# Patient Record
Sex: Male | Born: 1950 | Race: White | Hispanic: No | Marital: Married | Smoking: Never smoker
Health system: Southern US, Community
[De-identification: ages and names within clinical notes are randomized; demographics above are authoritative.]

## PROBLEM LIST (undated history)

## (undated) DIAGNOSIS — F329 Major depressive disorder, single episode, unspecified: Secondary | ICD-10-CM

## (undated) DIAGNOSIS — F32A Depression, unspecified: Secondary | ICD-10-CM

## (undated) HISTORY — PX: HERNIA REPAIR: SHX51

---

## 2016-05-04 DIAGNOSIS — G4700 Insomnia, unspecified: Secondary | ICD-10-CM | POA: Diagnosis not present

## 2016-05-11 DIAGNOSIS — R066 Hiccough: Secondary | ICD-10-CM | POA: Diagnosis not present

## 2016-05-18 ENCOUNTER — Encounter (HOSPITAL_COMMUNITY): Payer: Self-pay | Admitting: *Deleted

## 2016-05-18 ENCOUNTER — Emergency Department (HOSPITAL_COMMUNITY)
Admission: EM | Admit: 2016-05-18 | Discharge: 2016-05-18 | Disposition: A | Discharge status: Home / Self Care | Payer: PPO | Attending: Emergency Medicine | Admitting: Emergency Medicine

## 2016-05-18 ENCOUNTER — Emergency Department (HOSPITAL_COMMUNITY): Payer: PPO

## 2016-05-18 DIAGNOSIS — R066 Hiccough: Secondary | ICD-10-CM | POA: Insufficient documentation

## 2016-05-18 LAB — COMPREHENSIVE METABOLIC PANEL
ALT: 59 U/L (ref 17–63)
ANION GAP: 8 (ref 5–15)
AST: 42 U/L — ABNORMAL HIGH (ref 15–41)
Albumin: 3.3 g/dL — ABNORMAL LOW (ref 3.5–5.0)
Alkaline Phosphatase: 63 U/L (ref 38–126)
BUN: 18 mg/dL (ref 6–20)
CHLORIDE: 96 mmol/L — AB (ref 101–111)
CO2: 32 mmol/L (ref 22–32)
CREATININE: 0.84 mg/dL (ref 0.61–1.24)
Calcium: 8.7 mg/dL — ABNORMAL LOW (ref 8.9–10.3)
GFR calc non Af Amer: >60 mL/min (ref >60)
Glucose, Bld: 106 mg/dL — ABNORMAL HIGH (ref 65–99)
POTASSIUM: 3.2 mmol/L — AB (ref 3.5–5.1)
SODIUM: 136 mmol/L (ref 135–145)
Total Bilirubin: 0.6 mg/dL (ref 0.3–1.2)
Total Protein: 8 g/dL (ref 6.5–8.1)

## 2016-05-18 LAB — CBC
HCT: 36.9 % — ABNORMAL LOW (ref 39.0–52.0)
Hemoglobin: 12.1 g/dL — ABNORMAL LOW (ref 13.0–17.0)
MCH: 26.8 pg (ref 26.0–34.0)
MCHC: 32.8 g/dL (ref 30.0–36.0)
MCV: 81.8 fL (ref 78.0–100.0)
PLATELETS: 320 10*3/uL (ref 150–400)
RBC: 4.51 MIL/uL (ref 4.22–5.81)
RDW: 13.4 % (ref 11.5–15.5)
WBC: 19.6 10*3/uL — ABNORMAL HIGH (ref 4.0–10.5)

## 2016-05-18 MED ORDER — DIAZEPAM 5 MG PO TABS: 5.0000 mg | ORAL_TABLET | Freq: Three times a day (TID) | ORAL | 0 refills | Status: DC | PRN

## 2016-05-18 MED ORDER — DIAZEPAM 5 MG PO TABS
5.0000 mg | ORAL_TABLET | Freq: Once | ORAL | Status: AC
  Administered 2016-05-18: 5 mg via ORAL
  Filled 2016-05-18: qty 1

## 2016-05-18 MED ORDER — KETOROLAC TROMETHAMINE 30 MG/ML IJ SOLN
30.0000 mg | Freq: Once | INTRAMUSCULAR | Status: AC
Start: 2016-05-18 — End: 2016-05-18
  Administered 2016-05-18: 30 mg via INTRAVENOUS
  Filled 2016-05-18: qty 1

## 2016-05-18 MED ORDER — LORAZEPAM 2 MG/ML IJ SOLN
1.0000 mg | Freq: Once | INTRAMUSCULAR | Status: AC
  Administered 2016-05-18: 1 mg via INTRAVENOUS
  Filled 2016-05-18: qty 1

## 2016-05-18 NOTE — ED Triage Notes (Signed)
Pt complains of hiccups for the past 10 days. Pt states the hiccups started after he vomited. Pt denies nausea or emesis.

## 2016-05-18 NOTE — ED Notes (Signed)
Pt states that hiccups have seemed to have gone away at this time.

## 2016-05-18 NOTE — ED Provider Notes (Signed)
[facility] DEPT Provider Note   CSN: [id number] Arrival date & time: 05/18/16  1134  First Provider Contact:  First MD Initiated Contact with Patient 05/18/16 1309        History   Chief Complaint Chief Complaint  Patient presents with  . Hiccups    HPI Caleb Berg is a 65 y.o. male.  Patient reports persistent hiccups for the past 12 days.  He did have some resolution of his pickups 2 days ago for several hours but then they began again.  He's been to the urgent care twice and tried a medication which she does not know the name of without relief.  He denies chest pain shortness of breath.  He denies abdominal pain.  Denies nausea vomiting or diarrhea.  He reports his symptoms are debilitating and affecting his sleep.   The history is provided by the patient and medical records.    History reviewed. No pertinent past medical history.  There are no active problems to display for this patient.   Past Surgical History:  Procedure Laterality Date  . HERNIA REPAIR         Home Medications    Prior to Admission medications   Not on File    Family History No family history on file.  Social History Social History  Substance Use Topics  . Smoking status: Never Smoker  . Smokeless tobacco: Never Used  . Alcohol use No     Allergies   Review of patient's allergies indicates not on file.   Review of Systems Review of Systems  All other systems reviewed and are negative.    Physical Exam Updated Vital Signs BP 135/85 (BP Location: Left Arm)   Pulse 94   Temp 99 F (37.2 C) (Oral)   Resp 21   SpO2 100%   Physical Exam  Constitutional: He is oriented to person, place, and time. He appears well-developed and well-nourished.  HENT:  Head: Normocephalic and atraumatic.  Eyes: EOM are normal.  Neck: Normal range of motion.  Cardiovascular: Normal rate, regular rhythm, normal heart sounds and intact distal pulses.   Pulmonary/Chest: Effort normal and  breath sounds normal. No respiratory distress.  Persistent diaphragmatic pickups present  Abdominal: Soft. He exhibits no distension. There is no tenderness.  Musculoskeletal: Normal range of motion.  Neurological: He is alert and oriented to person, place, and time.  Skin: Skin is warm and dry.  Psychiatric: He has a normal mood and affect. Judgment normal.  Nursing note and vitals reviewed.    ED Treatments / Results  Labs (all labs ordered are listed, but only abnormal results are displayed) Labs Reviewed  CBC  COMPREHENSIVE METABOLIC PANEL    EKG  EKG Interpretation None       Radiology No results found.  Procedures Procedures (including critical care time)  Medications Ordered in ED Medications  LORazepam (ATIVAN) injection 1 mg (1 mg Intravenous Given 05/18/16 1335)  ketorolac (TORADOL) 30 MG/ML injection 30 mg (30 mg Intravenous Given 05/18/16 1335)     Initial Impression / Assessment and Plan / ED Course  I have reviewed the triage vital signs and the nursing notes.  Pertinent labs & imaging results that were available during my care of the patient were reviewed by me and considered in my medical decision making (see chart for details).  Clinical Course    3:09 PM Hiccups resolved.  Labs and x-rays without abnormality.  Discharge home with when necessary Valium for hiccups  Final Clinical  Impressions(s) / ED Diagnoses   Final diagnoses:  Hiccups    New Prescriptions New Prescriptions   No medications on file     Jola Schmidt, MD 05/18/16 [phone]

## 2016-05-18 NOTE — ED Notes (Signed)
Patient states that he has no medical history other than a hernia repair in 2005.

## 2016-11-23 DIAGNOSIS — G479 Sleep disorder, unspecified: Secondary | ICD-10-CM | POA: Diagnosis not present

## 2016-12-06 DIAGNOSIS — G478 Other sleep disorders: Secondary | ICD-10-CM | POA: Diagnosis not present

## 2016-12-08 ENCOUNTER — Encounter: Payer: Self-pay | Admitting: Physician Assistant

## 2016-12-08 ENCOUNTER — Ambulatory Visit (INDEPENDENT_AMBULATORY_CARE_PROVIDER_SITE_OTHER): Payer: PPO | Admitting: Physician Assistant

## 2016-12-08 VITALS — BP 143/87 | HR 65 | Temp 98.2°F | Resp 18 | Ht 66.0 in | Wt 165.0 lb

## 2016-12-08 DIAGNOSIS — G479 Sleep disorder, unspecified: Secondary | ICD-10-CM | POA: Diagnosis not present

## 2016-12-08 MED ORDER — HYDROXYZINE HCL 50 MG PO TABS: ORAL_TABLET | ORAL | 1 refills | Status: DC

## 2016-12-08 NOTE — Patient Instructions (Addendum)
Try taking hydroxyzine 1/2 tablet nightly for sleep can increase up to 2 tablets a night for sleep. Take one hour prior to when you would like to go to sleep. Also read up on sleep hygeine that I printed out for you. Follow up in 4 weeks for sleep reevaluation.  Thank you for letting me participate in your health and well being.      Insomnia Insomnia is a sleep disorder that makes it difficult to fall asleep or to stay asleep. Insomnia can cause tiredness (fatigue), low energy, difficulty concentrating, mood swings, and poor performance at work or school. There are three different ways to classify insomnia:  Difficulty falling asleep.  Difficulty staying asleep.  Waking up too early in the morning. Any type of insomnia can be long-term (chronic) or short-term (acute). Both are common. Short-term insomnia usually lasts for three months or less. Chronic insomnia occurs at least three times a week for longer than three months. What are the causes? Insomnia may be caused by another condition, situation, or substance, such as:  Anxiety.  Certain medicines.  Gastroesophageal reflux disease (GERD) or other gastrointestinal conditions.  Asthma or other breathing conditions.  Restless legs syndrome, sleep apnea, or other sleep disorders.  Chronic pain.  Menopause. This may include hot flashes.  Stroke.  Abuse of alcohol, tobacco, or illegal drugs.  Depression.  Caffeine.  Neurological disorders, such as Alzheimer disease.  An overactive thyroid (hyperthyroidism). The cause of insomnia may not be known. What increases the risk? Risk factors for insomnia include:  Gender. Women are more commonly affected than men.  Age. Insomnia is more common as you get older.  Stress. This may involve your professional or personal life.  Income. Insomnia is more common in people with lower income.  Lack of exercise.  Irregular work schedule or night shifts.  Traveling between  different time zones. What are the signs or symptoms? If you have insomnia, trouble falling asleep or trouble staying asleep is the main symptom. This may lead to other symptoms, such as:  Feeling fatigued.  Feeling nervous about going to sleep.  Not feeling rested in the morning.  Having trouble concentrating.  Feeling irritable, anxious, or depressed. How is this treated? Treatment for insomnia depends on the cause. If your insomnia is caused by an underlying condition, treatment will focus on addressing the condition. Treatment may also include:  Medicines to help you sleep.  Counseling or therapy.  Lifestyle adjustments. Follow these instructions at home:  Take medicines only as directed by your health care provider.  Keep regular sleeping and waking hours. Avoid naps.  Keep a sleep diary to help you and your health care provider figure out what could be causing your insomnia. Include:  When you sleep.  When you wake up during the night.  How well you sleep.  How rested you feel the next day.  Any side effects of medicines you are taking.  What you eat and drink.  Make your bedroom a comfortable place where it is easy to fall asleep:  Put up shades or special blackout curtains to block light from outside.  Use a white noise machine to block noise.  Keep the temperature cool.  Exercise regularly as directed by your health care provider. Avoid exercising right before bedtime.  Use relaxation techniques to manage stress. Ask your health care provider to suggest some techniques that may work well for you. These may include:  Breathing exercises.  Routines to release muscle tension.  Visualizing peaceful scenes.  Cut back on alcohol, caffeinated beverages, and cigarettes, especially close to bedtime. These can disrupt your sleep.  Do not overeat or eat spicy foods right before bedtime. This can lead to digestive discomfort that can make it hard for you to  sleep.  Limit screen use before bedtime. This includes:  Watching TV.  Using your smartphone, tablet, and computer.  Stick to a routine. This can help you fall asleep faster. Try to do a quiet activity, brush your teeth, and go to bed at the same time each night.  Get out of bed if you are still awake after 15 minutes of trying to sleep. Keep the lights down, but try reading or doing a quiet activity. When you feel sleepy, go back to bed.  Make sure that you drive carefully. Avoid driving if you feel very sleepy.  Keep all follow-up appointments as directed by your health care provider. This is important. Contact a health care provider if:  You are tired throughout the day or have trouble in your daily routine due to sleepiness.  You continue to have sleep problems or your sleep problems get worse. Get help right away if:  You have serious thoughts about hurting yourself or someone else. This information is not intended to replace advice given to you by your health care provider. Make sure you discuss any questions you have with your health care provider. Document Released: 10/01/2000 Document Revised: 03/05/2016 Document Reviewed: 07/05/2014 Elsevier Interactive Patient Education  2017 [employer].     IF you received an x-ray today, you will receive an invoice from [facility] Rad[facility]. Please contact [facility] Rad[facility] at [phone] with questions or concerns regarding your invoice.   IF you received labwork today, you will receive an invoice from [facility]. Please contact [facility] at [phone] with questions or concerns regarding your invoice.   Our billing staff will not be able to assist you with questions regarding bills from these companies.  You will be contacted with the lab results as soon as they are available. The fastest way to get your results is to activate your My Chart account. Instructions are located on the last page of this paperwork. If you have not  heard from [place] regarding the results in 2 weeks, please contact this office.

## 2016-12-08 NOTE — Progress Notes (Signed)
   Kelston Masar  MRN: [id number] DOB: Mar 22, 1951  Subjective:  Caleb Berg is a 66 y.o. male seen in office today for a chief complaint of sleep issues x 7 years since his divorce. Notes he does not have problems going to sleep but it has issues staying asleep. He typically goes to bed at 8pm and will wake up around 1-2am and then again at 4am. He typically will try to just roll over and go back to sleep but this can take up to an hour. Denies snoring. He does not feel as if his inability to go back to sleep has anything to do with underlying depression or anxiety. He does feel very tired throughout the day. He tries to avoid naps throughout the day. He avoids caffeine after noon. Does not drink alcohol or smoke cigarettes. He uses his room only for sleeping. He works at [employer] and recently changed shifts and thinks this is contributing to the issue. He has tried advil pm and melantonin and notes it does not do anything for him. He has not tried any relaxation techniques and is very set on receiving medication to help with sleep. No FH of sleep apnea.   Review of Systems  Constitutional: Negative for chills and diaphoresis.  HENT: Negative for congestion.   Respiratory: Negative for cough and shortness of breath.   Cardiovascular: Negative for chest pain and palpitations.  Psychiatric/Behavioral: Negative for dysphoric mood. The patient is not nervous/anxious.     There are no active problems to display for this patient.  No current outpatient prescriptions on file prior to visit.   No current facility-administered medications on file prior to visit.     No Known Allergies   Objective:  BP (!) 143/87   Pulse 65   Temp 98.2 F (36.8 C) (Oral)   Resp 18   Ht 5\' 6"  (1.676 m)   Wt 165 lb (74.8 kg)   SpO2 100%   BMI 26.63 kg/m   Physical Exam  Constitutional: He is oriented to person, place, and time and well-developed, well-nourished, and in no distress.  HENT:  Head:  Normocephalic and atraumatic.  Eyes: Conjunctivae are normal.  Neck: Normal range of motion.  Cardiovascular: Normal rate, regular rhythm and normal heart sounds.   Pulmonary/Chest: Effort normal and breath sounds normal.  Neurological: He is alert and oriented to person, place, and time. Gait normal.  Skin: Skin is warm and dry.  Psychiatric: Affect normal.  Vitals reviewed.  Assessment and Plan :  1. Sleep disturbance Pt given educational material on cognitive behavioral therapy techniques to help with sleeping and instructed to give these techniques a try. Had a long discussion with patient that research shows cognitive behavior therapy is more effective long term while sleep medication is usually on a temporary solution. Pt given Rx for hydroxyzine and instructed to start out at low dose and taper up to 2 tablets at night to help with sleep. Instructed to RTC in 4 weeks for reevaluation.  - hydrOXYzine (ATARAX/VISTARIL) 50 MG tablet; Take 1/2-2 tablets as needed for sleep.  Dispense: 60 tablet; Refill: 1  A total of 30 minutes was spent in the room with the patient, greater than 50% of which was in discussion of treatment for patient's sleep disturbance.   Tenna Delaine PA-C  [facility] 12/08/2016 5:02 PM

## 2016-12-13 ENCOUNTER — Telehealth: Payer: Self-pay

## 2016-12-13 NOTE — Telephone Encounter (Signed)
Form also sent to [place], in [facility] box

## 2016-12-13 NOTE — Telephone Encounter (Signed)
Hydroxyzine not covered   Key [id number] Cover mymeds

## 2016-12-14 NOTE — Telephone Encounter (Signed)
I have completed the forms and placed them in the "to be faxed" box.

## 2017-01-05 DIAGNOSIS — F332 Major depressive disorder, recurrent severe without psychotic features: Secondary | ICD-10-CM | POA: Diagnosis not present

## 2017-01-06 DIAGNOSIS — F332 Major depressive disorder, recurrent severe without psychotic features: Secondary | ICD-10-CM | POA: Diagnosis not present

## 2017-01-11 ENCOUNTER — Emergency Department (HOSPITAL_COMMUNITY)
Admission: EM | Admit: 2017-01-11 | Discharge: 2017-01-12 | Disposition: A | Discharge status: Other Acute Inpatient Hospital | Payer: PPO | Attending: Emergency Medicine | Admitting: Emergency Medicine

## 2017-01-11 ENCOUNTER — Encounter (HOSPITAL_COMMUNITY): Payer: Self-pay

## 2017-01-11 ENCOUNTER — Ambulatory Visit (HOSPITAL_COMMUNITY)
Admission: EM | Admit: 2017-01-11 | Discharge: 2017-01-11 | Disposition: A | Discharge status: Home / Self Care | Payer: PPO | Source: Home / Self Care

## 2017-01-11 DIAGNOSIS — R45851 Suicidal ideations: Secondary | ICD-10-CM

## 2017-01-11 DIAGNOSIS — F32A Depression, unspecified: Secondary | ICD-10-CM

## 2017-01-11 DIAGNOSIS — Z79899 Other long term (current) drug therapy: Secondary | ICD-10-CM | POA: Diagnosis not present

## 2017-01-11 DIAGNOSIS — F329 Major depressive disorder, single episode, unspecified: Secondary | ICD-10-CM | POA: Insufficient documentation

## 2017-01-11 LAB — CBC
HEMATOCRIT: 39.4 % (ref 39.0–52.0)
HEMOGLOBIN: 13 g/dL (ref 13.0–17.0)
MCH: 28.3 pg (ref 26.0–34.0)
MCHC: 33 g/dL (ref 30.0–36.0)
MCV: 85.7 fL (ref 78.0–100.0)
Platelets: 180 10*3/uL (ref 150–400)
RBC: 4.6 MIL/uL (ref 4.22–5.81)
RDW: 14.2 % (ref 11.5–15.5)
WBC: 7.6 10*3/uL (ref 4.0–10.5)

## 2017-01-11 LAB — RAPID URINE DRUG SCREEN, HOSP PERFORMED
Amphetamines: NOT DETECTED
BARBITURATES: NOT DETECTED
Benzodiazepines: NOT DETECTED
COCAINE: NOT DETECTED
Opiates: NOT DETECTED
TETRAHYDROCANNABINOL: NOT DETECTED

## 2017-01-11 LAB — COMPREHENSIVE METABOLIC PANEL
ALBUMIN: 3.7 g/dL (ref 3.5–5.0)
ALT: 40 U/L (ref 17–63)
AST: 30 U/L (ref 15–41)
Alkaline Phosphatase: 71 U/L (ref 38–126)
Anion gap: 10 (ref 5–15)
BILIRUBIN TOTAL: 0.4 mg/dL (ref 0.3–1.2)
BUN: 18 mg/dL (ref 6–20)
CALCIUM: 9.3 mg/dL (ref 8.9–10.3)
CHLORIDE: 97 mmol/L — AB (ref 101–111)
CO2: 30 mmol/L (ref 22–32)
Creatinine, Ser: 0.95 mg/dL (ref 0.61–1.24)
GFR calc Af Amer: >60 mL/min (ref >60)
GFR calc non Af Amer: >60 mL/min (ref >60)
GLUCOSE: 180 mg/dL — AB (ref 65–99)
POTASSIUM: 4.3 mmol/L (ref 3.5–5.1)
Sodium: 137 mmol/L (ref 135–145)
TOTAL PROTEIN: 6.5 g/dL (ref 6.5–8.1)

## 2017-01-11 LAB — ACETAMINOPHEN LEVEL

## 2017-01-11 LAB — SALICYLATE LEVEL: Salicylate Lvl: <7 mg/dL (ref 2.8–30.0)

## 2017-01-11 LAB — ETHANOL: Alcohol, Ethyl (B): <5 mg/dL (ref <5)

## 2017-01-11 NOTE — ED Triage Notes (Signed)
Pt endorses depression and states "I go to bed every night hoping that I won't wake up and I've tried to kill myself 3 times in the last 2-3 weeks by overdosing on sleeping pills" pt states that he has no current plan for hurting self stating "I've given up on trying to kill myself because I can't figure out how to do it" VSS. Pt alert and oriented x 4. Pt denies alcohol or drug use and denies hallucinations. Pt cooperative in triage. Pt not on any medications currently.

## 2017-01-11 NOTE — ED Provider Notes (Signed)
  Face-to-face evaluation   History: She reports helplessness or hopelessness and depressive symptoms, related to loss of job, divorce, and ongoing chronic depression.  Does not currently see a therapist or take medications.  He has had thoughts of suicide, but no current active plan.  Physical exam: Alert, cooperative.  No dysarthria or aphasia or nystagmus.  No respiratory distress.  He is cooperative and interactive.  Medical screening examination/treatment/procedure(s) were conducted as a shared visit with non-physician practitioner(s) and myself.  I personally evaluated the patient during the encounter   Daleen Bo, MD 01/12/17 [phone]

## 2017-01-11 NOTE — ED Notes (Signed)
Pt and family are aware of waiting room assignment

## 2017-01-11 NOTE — BH Assessment (Signed)
Tele Assessment Note   Caleb Berg is an 66 y.o. male presenting to the ER today after several discouraging events over the last few weeks. The patient has untreated depression. Reports overdosing numerous times in his life, last time was three weeks ago. The patient had a job at [employer]. His hours were decreased and he was unable to pay his bills. The patient first reported he lost his job but then admitted he quit three weeks ago and hasn't been able to find another job.  Reported other stressors include his divorce in 2011 and his lack of relationship with is daughter that started about the same time.   The patient often deflects with humor when asked questions about his life. He denies depressive symptoms but appeared to have self loathing and feelings of worthlessness, euthymic mood, was oriented, had fair judgement, poor insight.  He denies he is currently SI, HI or A/V. The patient went to an outpatient clinic recently and started on anti-depressants but quite after two days. A nurse also came out his home for a 2 hr home visit yesterday. She recommends he go to [place] and a [employer] for financial help. He was told today at [facility] they could not help him and needed a receipt of his bill in order for the church to help.   He was discouraged by this, called a friend who told him to go to the ER.  When asked what would keep him from hurting himself he stated he was too chicken to do it. The patient's history does not support this statement or his recent attempt three weeks ago on sleeping pills.  The patient denied any history of substance abuse past or present but then admitted to a DUI in 64'. Denies any abuse history. Reports 2 yrs of college. No criminal charges or court dates.  No weapons. Admits to anxiety. The patient is willing to sign himself in voluntarily. No previous inpatient.   Patriciaann Clan NP recommends inpatient treatment on a voluntary basis.   Diagnosis: Major Depressive  Disorder, recurrent severe, without psychosis   Past Medical History: History reviewed. No pertinent past medical history.  Past Surgical History:  Procedure Laterality Date  . HERNIA REPAIR      Family History: History reviewed. No pertinent family history.  Social History:  reports that he has never smoked. He has never used smokeless tobacco. He reports that he does not drink alcohol or use drugs.  Additional Social History:  Alcohol / Drug Use Pain Medications: see MAR Prescriptions: see MAR Over the Counter: see MAR History of alcohol / drug use?: No history of alcohol / drug abuse  CIWA: CIWA-Ar BP: (!) 146/84 Pulse Rate: 72 COWS:    PATIENT STRENGTHS: (choose at least two) Average or above average intelligence Capable of independent living  Allergies: No Known Allergies  Home Medications:  (Not in a hospital admission)  OB/GYN Status:  No LMP for male patient.  General Assessment Data Location of Assessment: [facility] ED TTS Assessment: In system Is this a Tele or Face-to-Face Assessment?: Tele Assessment Is this an Initial Assessment or a Re-assessment for this encounter?: Initial Assessment Marital status: Divorced Is patient pregnant?: No Pregnancy Status: No Living Arrangements: Alone Can pt return to current living arrangement?: Yes Admission Status: Voluntary Is patient capable of signing voluntary admission?: Yes Referral Source: Self/Family/Friend Insurance type: [employer] advantage  Medical Screening Exam ([place]) Medical Exam completed: Yes  Crisis Care Plan Living Arrangements: Alone Name  of Psychiatrist: n/a Name of Therapist: n/a  Education Status Is patient currently in school?: No Highest grade of school patient has completed: 2 yrs of college  Risk to self with the past 6 months Suicidal Ideation: Yes-Currently Present Has patient been a risk to self within the past 6 months prior to admission? : Yes Suicidal Intent: No Has  patient had any suicidal intent within the past 6 months prior to admission? : Yes Is patient at risk for suicide?: Yes Suicidal Plan?: No Has patient had any suicidal plan within the past 6 months prior to admission? : Yes Access to Means: Yes Specify Access to Suicidal Means: pills What has been your use of drugs/alcohol within the last 12 months?: n/a Previous Attempts/Gestures: Yes How many times?: 5 Triggers for Past Attempts: Unknown Intentional Self Injurious Behavior: None Family Suicide History: Unknown Recent stressful life event(s): Financial Problems Persecutory voices/beliefs?: No Depression: Yes Depression Symptoms: Feeling worthless/self pity Substance abuse history and/or treatment for substance abuse?: No Suicide prevention information given to non-admitted patients: Not applicable  Risk to Others within the past 6 months Homicidal Ideation: No Does patient have any lifetime risk of violence toward others beyond the six months prior to admission? : No Thoughts of Harm to Others: No Current Homicidal Intent: No Current Homicidal Plan: No Access to Homicidal Means: No History of harm to others?: No Assessment of Violence: None Noted Does patient have access to weapons?: No Criminal Charges Pending?: No Does patient have a court date: No Is patient on probation?: No  Psychosis Hallucinations: None noted Delusions: None noted  Mental Status Report Appearance/Hygiene: Unremarkable Eye Contact: Good Motor Activity: Freedom of movement Speech: Logical/coherent Level of Consciousness: Alert Mood: Helpless, Euthymic Affect: Inconsistent with thought content Anxiety Level: Moderate Thought Processes: Coherent, Relevant Judgement: Partial Orientation: Person, Place, Time, Situation Obsessive Compulsive Thoughts/Behaviors: None  Cognitive Functioning Concentration: Normal Memory: Recent Intact, Remote Intact IQ: Average Insight: Poor Impulse Control:  Fair Appetite: Good Sleep: No Change Vegetative Symptoms: None  ADLScreening [facility] Assessment Services) Patient's cognitive ability adequate to safely complete daily activities?: Yes Patient able to express need for assistance with ADLs?: Yes Independently performs ADLs?: Yes (appropriate for developmental age)  Prior Inpatient Therapy Prior Inpatient Therapy: No  Prior Outpatient Therapy Prior Outpatient Therapy: Yes Prior Therapy Dates: past and most recently last week Prior Therapy Facilty/Provider(s): [address] Reason for Treatment: Depression Does patient have an ACCT team?: Yes Does patient have Intensive In-House Services?  : No Does patient have [facility] services? : Yes Does patient have P4CC services?: No  ADL Screening (condition at time of admission) Patient's cognitive ability adequate to safely complete daily activities?: Yes Is the patient deaf or have difficulty hearing?: No Does the patient have difficulty concentrating, remembering, or making decisions?: No Patient able to express need for assistance with ADLs?: Yes Does the patient have difficulty dressing or bathing?: No Independently performs ADLs?: Yes (appropriate for developmental age)            Regulatory affairs officer (For Healthcare) Does Patient Have a Medical Advance Directive?: No Would patient like information on creating a medical advance directive?: No - Patient declined    Additional Information 1:1 In Past 12 Months?: No CIRT Risk: No Elopement Risk: No Does patient have medical clearance?: Yes     Disposition:  Disposition Initial Assessment Completed for this Encounter: Yes Disposition of Patient: Inpatient treatment program Type of inpatient treatment program: Adult  Mollie Germany 01/11/2017 11:52 PM

## 2017-01-11 NOTE — ED Notes (Signed)
Pt changing into scrubs. Staffing notified of need for sitter.

## 2017-01-11 NOTE — ED Provider Notes (Signed)
[facility] DEPT Provider Note   CSN: [id number] Arrival date & time: 01/11/17  1557     History   Chief Complaint Chief Complaint  Patient presents with  . Depression  . Suicidal    HPI Caleb Berg is a 66 y.o. male.  This a 66 year old male with a long-standing history of depression.  No hospitalizations.  His recently become more depressed.  He does not take any medication for his symptoms as he states he's taken that in the past and they've not worked.  Has no therapy or counselor at this time. He recently lost his job and is concerned about paying bills for the past 3 weeks.  He's at several episodes/attempts at suicide by taking sleeping pills, 5-10 at a time, but is upset with himself because he's been unsuccessful.      History reviewed. No pertinent past medical history.  There are no active problems to display for this patient.   Past Surgical History:  Procedure Laterality Date  . HERNIA REPAIR         Home Medications    Prior to Admission medications   Medication Sig Start Date End Date Taking? Authorizing Provider  hydrOXYzine (ATARAX/VISTARIL) 50 MG tablet Take 1/2-2 tablets as needed for sleep. Patient taking differently: Take 25-50 mg by mouth at bedtime as needed (sleep).  12/08/16  Yes Leonie Douglas, PA-C    Family History History reviewed. No pertinent family history.  Social History Social History  Substance Use Topics  . Smoking status: Never Smoker  . Smokeless tobacco: Never Used  . Alcohol use No     Allergies   Patient has no known allergies.   Review of Systems Review of Systems  Constitutional: Negative for fever.  Respiratory: Negative for cough.   Cardiovascular: Negative for chest pain.  Neurological: Negative for headaches.  Psychiatric/Behavioral: Positive for suicidal ideas.  All other systems reviewed and are negative.    Physical Exam Updated Vital Signs BP (!) 146/84 (BP Location: Left Arm)    Pulse 72   Temp 98 F (36.7 C) (Oral)   Resp 20   SpO2 98%   Physical Exam  Constitutional: He is oriented to person, place, and time. He appears well-developed and well-nourished.  HENT:  Head: Normocephalic.  Eyes: Pupils are equal, round, and reactive to light.  Pulmonary/Chest: Effort normal.  Musculoskeletal: Normal range of motion.  Neurological: He is alert and oriented to person, place, and time.  Skin: Skin is warm and dry.  Psychiatric: His speech is normal and behavior is normal. Judgment normal. Cognition and memory are normal. He exhibits a depressed mood. He expresses suicidal ideation. He expresses suicidal plans.  Nursing note and vitals reviewed.    ED Treatments / Results  Labs (all labs ordered are listed, but only abnormal results are displayed) Labs Reviewed  COMPREHENSIVE METABOLIC PANEL - Abnormal; Notable for the following:       Result Value   Chloride 97 (*)    Glucose, Bld 180 (*)    All other components within normal limits  ACETAMINOPHEN LEVEL - Abnormal; Notable for the following:    Acetaminophen (Tylenol), Serum <10 (*)    All other components within normal limits  ETHANOL  SALICYLATE LEVEL  CBC  RAPID URINE DRUG SCREEN, HOSP PERFORMED    EKG  EKG Interpretation None       Radiology No results found.  Procedures Procedures (including critical care time)  Medications Ordered in ED Medications - No data  to display   Initial Impression / Assessment and Plan / ED Course  I have reviewed the triage vital signs and the nursing notes.  Pertinent labs & imaging results that were available during my care of the patient were reviewed by me and considered in my medical decision making (see chart for details).      Patient has been medically cleared for TTS as evaluation TTS evaluation has been completed.  Recommended for inpatient placement Final Clinical Impressions(s) / ED Diagnoses   Final diagnoses:  Depression, unspecified  depression type  Suicidal ideations    New Prescriptions New Prescriptions   No medications on file     Junius Creamer, NP 01/11/17 2218    Junius Creamer, NP 01/12/17 0025    Daleen Bo, MD 01/12/17 3568

## 2017-01-11 NOTE — ED Notes (Signed)
TTS in progress 

## 2017-01-12 ENCOUNTER — Encounter (HOSPITAL_COMMUNITY): Payer: Self-pay | Admitting: *Deleted

## 2017-01-12 ENCOUNTER — Inpatient Hospital Stay (HOSPITAL_COMMUNITY)
Admission: EM | Admit: 2017-01-12 | Discharge: 2017-01-15 | DRG: 885 | Disposition: A | Discharge status: Home / Self Care | Payer: PPO | Source: Intra-hospital | Attending: Psychiatry | Admitting: Psychiatry

## 2017-01-12 DIAGNOSIS — Z818 Family history of other mental and behavioral disorders: Secondary | ICD-10-CM | POA: Diagnosis not present

## 2017-01-12 DIAGNOSIS — Z560 Unemployment, unspecified: Secondary | ICD-10-CM

## 2017-01-12 DIAGNOSIS — F332 Major depressive disorder, recurrent severe without psychotic features: Principal | ICD-10-CM

## 2017-01-12 DIAGNOSIS — Z79899 Other long term (current) drug therapy: Secondary | ICD-10-CM

## 2017-01-12 DIAGNOSIS — F419 Anxiety disorder, unspecified: Secondary | ICD-10-CM | POA: Diagnosis present

## 2017-01-12 DIAGNOSIS — Z915 Personal history of self-harm: Secondary | ICD-10-CM

## 2017-01-12 DIAGNOSIS — Z639 Problem related to primary support group, unspecified: Secondary | ICD-10-CM

## 2017-01-12 DIAGNOSIS — G4700 Insomnia, unspecified: Secondary | ICD-10-CM | POA: Diagnosis present

## 2017-01-12 DIAGNOSIS — R45851 Suicidal ideations: Secondary | ICD-10-CM

## 2017-01-12 LAB — LIPID PANEL
CHOL/HDL RATIO: 4.3 ratio
Cholesterol: 258 mg/dL — ABNORMAL HIGH (ref 0–200)
HDL: 60 mg/dL (ref >40)
LDL Cholesterol: 160 mg/dL — ABNORMAL HIGH (ref 0–99)
Triglycerides: 190 mg/dL — ABNORMAL HIGH (ref <150)
VLDL: 38 mg/dL (ref 0–40)

## 2017-01-12 LAB — TSH: TSH: 6.108 u[IU]/mL — AB (ref 0.350–4.500)

## 2017-01-12 MED ORDER — HYDROXYZINE HCL 25 MG PO TABS
25.0000 mg | ORAL_TABLET | Freq: Four times a day (QID) | ORAL | Status: DC | PRN
  Filled 2017-01-12: qty 10

## 2017-01-12 MED ORDER — MAGNESIUM HYDROXIDE 400 MG/5ML PO SUSP: 30.0000 mL | Freq: Every day | ORAL | Status: DC | PRN

## 2017-01-12 MED ORDER — ACETAMINOPHEN 325 MG PO TABS
650.0000 mg | ORAL_TABLET | Freq: Four times a day (QID) | ORAL | Status: DC | PRN
  Administered 2017-01-12: 650 mg via ORAL
  Filled 2017-01-12: qty 2

## 2017-01-12 MED ORDER — DULOXETINE HCL 20 MG PO CPEP
20.0000 mg | ORAL_CAPSULE | Freq: Two times a day (BID) | ORAL | Status: DC
  Administered 2017-01-12 – 2017-01-15 (×7): 20 mg via ORAL
  Filled 2017-01-12 (×8): qty 1
  Filled 2017-01-12: qty 14
  Filled 2017-01-12: qty 1
  Filled 2017-01-12: qty 14
  Filled 2017-01-12 (×2): qty 1

## 2017-01-12 MED ORDER — TRAZODONE HCL 50 MG PO TABS
50.0000 mg | ORAL_TABLET | Freq: Every evening | ORAL | Status: DC | PRN
  Administered 2017-01-12: 50 mg via ORAL
  Filled 2017-01-12 (×6): qty 1

## 2017-01-12 NOTE — Tx Team (Signed)
Interdisciplinary Treatment and Diagnostic Plan Update 01/12/2017 Time of Session: 9:30am  Caleb Berg  MRN: [id number]  Principal Diagnosis: Major depressive disorder, recurrent episode  Secondary Diagnoses: Active Problems:   MDD (major depressive disorder), recurrent episode, severe (HCC)   Current Medications:  Current Facility-Administered Medications  Medication Dose Route Frequency Provider Last Rate Last Dose  . acetaminophen (TYLENOL) tablet 650 mg  650 mg Oral Q6H PRN Laverle Hobby, PA-C      . DULoxetine (CYMBALTA) DR capsule 20 mg  20 mg Oral BID Laverle Hobby, PA-C   20 mg at 01/12/17 2423  . hydrOXYzine (ATARAX/VISTARIL) tablet 25 mg  25 mg Oral Q6H PRN Laverle Hobby, PA-C      . magnesium hydroxide (MILK OF MAGNESIA) suspension 30 mL  30 mL Oral Daily PRN Laverle Hobby, PA-C      . traZODone (DESYREL) tablet 50 mg  50 mg Oral QHS,MR X 1 Laverle Hobby, PA-C        PTA Medications: Prescriptions Prior to Admission  Medication Sig Dispense Refill Last Dose  . hydrOXYzine (ATARAX/VISTARIL) 50 MG tablet Take 1/2-2 tablets as needed for sleep. (Patient taking differently: Take 25-50 mg by mouth at bedtime as needed (sleep). ) 60 tablet 1 Past Week at Unknown time    Treatment Modalities: Medication Management, Group therapy, Case management,  1 to 1 session with clinician, Psychoeducation, Recreational therapy.  Patient Stressors: Financial difficulties Occupational concerns Patient Strengths: Ability for insight Average or above average intelligence Capable of independent living Curator fund of knowledge Motivation for treatment/growth Supportive family/friends Work Artist for Primary Diagnosis: Major depressive disorder, recurrent episode Long Term Goal(s): Improvement in symptoms so as ready for discharge Short Term Goals: Ability to identify changes in lifestyle to reduce recurrence of condition will  improve Ability to verbalize feelings will improve Ability to disclose and discuss suicidal ideas Ability to demonstrate self-control will improve Ability to identify and develop effective coping behaviors will improve Compliance with prescribed medications will improve Ability to identify triggers associated with substance abuse/mental health issues will improve  Medication Management: Evaluate patient's response, side effects, and tolerance of medication regimen.  Therapeutic Interventions: 1 to 1 sessions, Unit Group sessions and Medication administration.  Evaluation of Outcomes: Not Met  Physician Treatment Plan for Secondary Diagnosis: Active Problems:   MDD (major depressive disorder), recurrent episode, severe ([place])  Long Term Goal(s): Improvement in symptoms so as ready for discharge  Short Term Goals: Ability to identify changes in lifestyle to reduce recurrence of condition will improve Ability to verbalize feelings will improve Ability to disclose and discuss suicidal ideas Ability to demonstrate self-control will improve Ability to identify and develop effective coping behaviors will improve Compliance with prescribed medications will improve Ability to identify triggers associated with substance abuse/mental health issues will improve  Medication Management: Evaluate patient's response, side effects, and tolerance of medication regimen.  Therapeutic Interventions: 1 to 1 sessions, Unit Group sessions and Medication administration.  Evaluation of Outcomes: Not Met  RN Treatment Plan for Primary Diagnosis: Major depressive disorder, recurrent episode Long Term Goal(s): Knowledge of disease and therapeutic regimen to maintain health will improve  Short Term Goals: Ability to verbalize feelings will improve and Compliance with prescribed medications will improve  Medication Management: RN will administer medications as ordered by provider, will assess and evaluate  patient's response and provide education to patient for prescribed medication. RN will report any adverse and/or side effects  to prescribing provider.  Therapeutic Interventions: 1 on 1 counseling sessions, Psychoeducation, Medication administration, Evaluate responses to treatment, Monitor vital signs and CBGs as ordered, Perform/monitor CIWA, COWS, AIMS and Fall Risk screenings as ordered, Perform wound care treatments as ordered.  Evaluation of Outcomes: Progressing  LCSW Treatment Plan for Primary Diagnosis: Major depressive disorder, recurrent episode Long Term Goal(s): Safe transition to appropriate next level of care at discharge, Engage patient in therapeutic group addressing interpersonal concerns. Short Term Goals: Engage patient in aftercare planning with referrals and resources, Increase emotional regulation, Identify triggers associated with mental health/substance abuse issues and Increase skills for wellness and recovery  Therapeutic Interventions: Assess for all discharge needs, 1 to 1 time with Social worker, Explore available resources and support systems, Assess for adequacy in community support network, Educate family and significant other(s) on suicide prevention, Complete Psychosocial Assessment, Interpersonal group therapy.  Evaluation of Outcomes: Not Met  Progress in Treatment: Attending groups: Yes Participating in groups: Yes Taking medication as prescribed: Yes, MD continues to assess for medication changes as needed Toleration medication: Yes, no side effects reported at this time Family/Significant other contact made: No, CSW assessing for appropriate contact Patient understands diagnosis: Continuing to assess Discussing patient identified problems/goals with staff: Yes Medical problems stabilized or resolved: Yes Denies suicidal/homicidal ideation: No, pt was recently admitted with suicidal ideation. Issues/concerns per patient self-inventory: None Other:  N/A  New problem(s) identified: None identified at this time.   New Short Term/Long Term Goal(s): None identified at this time.   Discharge Plan or Barriers: Pt will return home and follow-up with an outpatient provider.  Reason for Continuation of Hospitalization:  Anxiety  Depression Medication stabilization Suicidal ideation  Estimated Length of Stay: 3-5 days  Attendees: Patient: 01/12/2017 3:53 PM  Physician: Dr. Parke Poisson 01/12/2017 3:53 PM  Nursing: Chrys Racer RN; [place], RN 01/12/2017 3:53 PM  RN Care Manager: Lars Pinks, RN 01/12/2017 3:53 PM  Social Worker: Matthew Saras, [place] 01/12/2017 3:53 PM  Recreational Therapist:  01/12/2017 3:53 PM  Other: Lindell Spar, NP; Samuel Jester, NP 01/12/2017 3:53 PM  Other:  01/12/2017 3:53 PM  Other: 01/12/2017 3:53 PM   Scribe for Treatment Team: Georga Kaufmann, MSW,LCSWA 01/12/2017 3:53 PM
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
## 2017-01-12 NOTE — BH Assessment (Signed)
Patriciaann Clan NP recommends inpatient treatment on a voluntary basis.  [facility] accepts to bed 407-1 after 1am. Dr. Parke Poisson. Call report to [phone]

## 2017-01-12 NOTE — BHH Group Notes (Signed)
[facility] LCSW Group Therapy 01/12/2017 1:15 PM  Type of Therapy: Group Therapy- Emotion Regulation  Participation Level: Active   Participation Quality:  Appropriate  Affect: Appropriate  Cognitive: Alert and Oriented   Insight:  Developing/Improving  Engagement in Therapy: Developing/Improving and Engaged   Modes of Intervention: Clarification, Confrontation, Discussion, Education, Exploration, Limit-setting, Orientation, Problem-solving, Rapport Building, Art therapist, Socialization and Support  Summary of Progress/Problems: The topic for group today was emotional regulation. This group focused on both positive and negative emotion identification and allowed group members to process ways to identify feelings, regulate negative emotions, and find healthy ways to manage internal/external emotions. Group members were asked to reflect on a time when their reaction to an emotion led to a negative outcome and explored how alternative responses using emotion regulation would have benefited them. Group members were also asked to discuss a time when emotion regulation was utilized when a negative emotion was experienced. Pt identified love and sadness as emotions that he has a difficult time regulating. Pt states that his daughter has not wanted to have a relationship since pt's divorce from her mother. Pt states that this makes him very sad and when he gets out of the hospital he plans to reach out to his daughter to reconcile.    Georga Kaufmann, MSW, LCSWA 01/12/2017 4:01 PM

## 2017-01-12 NOTE — ED Notes (Signed)
Sitter remains at the bedside 

## 2017-01-12 NOTE — Progress Notes (Signed)
Recreation Therapy Notes  Date: 01/12/17 Time: 0930 Location: [address] Dayroom  Group Topic: Stress Management  Goal Area(s) Addresses:  Patient will verbalize importance of using healthy stress management.  Patient will identify positive emotions associated with healthy stress management.   Intervention: Stress Management  Activity :  Body Scan Meditation.  LRT introduced the stress management technique of meditation.  LRT played a meditation that allowed patients to focus on the feelings and sensations they were feeling.  Patients were to follow along as the meditation played to engage in the activity.  Education:  Stress Management, Discharge Planning.   Education Outcome: Acknowledges edcuation/In group clarification offered/Needs additional education  Clinical Observations/Feedback: Pt did not attend group.   Victorino Sparrow, LRT/CTRS         Victorino Sparrow A 01/12/2017 12:29 PM

## 2017-01-12 NOTE — BHH Suicide Risk Assessment (Signed)
[facility] Admission Suicide Risk Assessment   Nursing information obtained from:  Patient Demographic factors:  Age 66 or older, Divorced or widowed, Caucasian, Low socioeconomic status, Living alone, Unemployed Current Mental Status:  NA Loss Factors:  Financial problems / change in socioeconomic status Historical Factors:  Family history of mental illness or substance abuse Risk Reduction Factors:  Sense of responsibility to family, Positive coping skills or problem solving skills  Total Time spent with patient: 45 minutes Principal Problem:  Major Depression, Recurrent, Severe Diagnosis:   Patient Active Problem List   Diagnosis Date Noted  . MDD (major depressive disorder), recurrent episode, severe ([place]) [F33.2] 01/12/2017   Continued Clinical Symptoms:  Alcohol Use Disorder Identification Test Final Score (AUDIT): 0 The "Alcohol Use Disorders Identification Test", Guidelines for Use in Primary Care, Second Edition.  World Pharmacologist [facility]). Score between 0-7:  no or low risk or alcohol related problems. Score between 8-15:  moderate risk of alcohol related problems. Score between 16-19:  high risk of alcohol related problems. Score 20 or above:  warrants further diagnostic evaluation for alcohol dependence and treatment.   CLINICAL FACTORS:  Patient is a 66 year old divorced  male.Lives alone.  He reports a long history of depression, prior history of suicidal ideations and overdose attempts ,but has had no prior psychiatric admissions . Recent psychosocial stressors have contributed to increased depression- reports his job recently cut down his hours, resulting in increased  financial difficulties. He also describes a distant relationship with his adult daughter. Presented to ED on 3/27, due to worsening depression, suicidal ideations, reported recent suicide attempts by overdosing on sleeping medication. Denies alcohol or drug abuse  No history of medical  illnesses Diagnosis- MDD, Recurrent, no psychotic features, severe Plan - inpatient admission. Has been started on Cymbalta 20 mgrs BID initially. As TSH mildly elevated, will check T3, T4 .    Musculoskeletal: Strength & Muscle Tone: within normal limits Gait & Station: normal Patient leans: N/A  Psychiatric Specialty Exam: Physical Exam  ROS denies chest pain, no shortness of breath, no fever, no chills   Blood pressure (!) 131/91, pulse (!) 104, temperature 98 F (36.7 C), temperature source Oral, resp. rate 18, height 5' 4.25" (1.632 m), weight 74.4 kg (164 lb).Body mass index is 27.93 kg/m.  General Appearance: Well Groomed  Eye Contact:  Good  Speech:  Normal Rate  Volume:  Normal  Mood:  Depressed  Affect:  constricted, but smiles at times appropriately   Thought Process:  Linear and Descriptions of Associations: Intact  Orientation:  Full (Time, Place, and Person)  Thought Content:  denies hallucinations, no delusions expressed   Suicidal Thoughts:  No denies active suicidal or self injurious ideations at this time , and contracts for safety on unit, denies any violent or homicidal ideations  Homicidal Thoughts:  No  Memory:  recent and remote grossly intact   Judgement:  Fair  Insight:  Fair  Psychomotor Activity:  Normal  Concentration:  Concentration: Good and Attention Span: Good  Recall:  Good  Fund of Knowledge:  Good  Language:  Good  Akathisia:  No  Handed:  Right  AIMS (if indicated):     Assets:  Desire for Improvement  ADL's:  Intact  Cognition:  WNL  Sleep:  Number of Hours: 2.25      COGNITIVE FEATURES THAT CONTRIBUTE TO RISK:  Closed-mindedness and Loss of executive function    SUICIDE RISK:   Moderate:  Frequent suicidal ideation  with limited intensity, and duration, some specificity in terms of plans, no associated intent, good self-control, limited dysphoria/symptomatology, some risk factors present, and identifiable protective factors,  including available and accessible social support.  PLAN OF CARE: Patient will be admitted to inpatient psychiatric unit for stabilization and safety. Will provide and encourage milieu participation. Provide medication management and maked adjustments as needed.  Will follow daily.    I certify that inpatient services furnished can reasonably be expected to improve the patient's condition.   Jenne Campus, MD 01/12/2017, 3:33 PM

## 2017-01-12 NOTE — Progress Notes (Signed)
Patient ID: Caleb Berg, male   DOB: 1951-01-16, 66 y.o.   MRN: [id number] Client is a 67 yo male admitted due to "depression" Client denies history of psychiatric hospitalizations or OPT. Client denies SHI, AVH, but does report feeling depressed all of his life and notes familiar hx "my mother had it, my brother had it" Per admit report client reported SA over the past 2-3 weeks, by OD on sleeping pills three times, but gave up on killing himself. Client reports "I was working at [employer] about 20-25 hours a week and the Radio broadcast assistant resigned and they asked me if I'd take the position, not thinking about it I said yes, then realized after weeks had gone by I couldn't take the stress, I couldn't do now what I could in my twenties" "I didn't want to deal with the pressure, told them I wanted to step down, it took them about three week to hire another Radio broadcast assistant and when they did my hours was cut to about 16 hours a week and I couldn't pay any bills with that so I quit" "I'm dealing with divorce and I have a 16 yo daughter that doesn't talk to me" Client report he has a brother here in [place] that's supportive and a sister in [place]. Client denies any significant medical history. Client lives alone. Admission process completed, client offered food drink, oriented to the unit, notified of q37min safety checks. Client is safe on the unit.

## 2017-01-12 NOTE — ED Notes (Signed)
[facility] transport called for transfer to [facility]

## 2017-01-12 NOTE — Progress Notes (Signed)
D: Patient has flat, blunted affect.  He has irritable mood.  His BP was elevated this morning and notified MD of same.  Patient rates his depression and anxiety as a 3; hopelessness as a 5.  Patient is sleeping well; his appetite is good; his energy is normal and his concentration is good.  Patient denies any thoughts of self harm.  He denies HI and does not appear to be responding to internal stimuli.  Patient is observed in the day room interacting well with his peers.   A: Continue to monitor medication management and MD orders.  Safety checks continued every 15 minutes per protocol.  Offer support and encouragement as needed. R: Patient is receptive to staff; his behavior is appropriate.

## 2017-01-12 NOTE — H&P (Signed)
Psychiatric Admission Assessment Adult  Patient Identification: Caleb Berg MRN:  [id number]  Date of Evaluation:  01/13/2017  Chief Complaint: Worsening symptoms of depression triggering suicidal ideations.   Principal Diagnosis: Major depressive disorder, recurrent episode.  Diagnosis:   Patient Active Problem List   Diagnosis Date Noted  . MDD (major depressive disorder), recurrent episode, severe ([place]) [F33.2] 01/12/2017   History of Present Illness: This is an admission assessment for this 66 year old Caucasian male with hx of depression. Admitted to the [facility] adult unit from the [facility] with complaints of worsening symptoms of depression. He came to the hospital for mood stabilization treatment. This is his first inpatient psychiatric hospitalization. During this assessment, Caleb Berg reports, "I drove to the hospital yesterday afternoon. I'm depressed, out of job, stressing on how to pay my bills. Then, my divorce in 2011 hit me hard. I believe, that was when the depression set in. I have a daughter that did not have much to do with me. I have been out of job x 3 weeks. I was kind of depressed prior to the job loss, only that it worsened the depression. I was started on depression medicines 2 & 1/2 weeks ago by [facility], but I did not take them. If I take the medicine, it may help the depression & yet, I'm still out of job. I guess if I get better by taking the medications, I will then be happily broke. I'm trying to get my life on the right tract, then find another job. My siblings are trying to help me out now. I was suicidal, I attempted suicide twice in two weeks by overdose on sleeping pills. It did not kill me because I'm still here. I will like to get back on depression medicines again while I'm here".  Associated Signs/Symptoms:  Depression Symptoms:  depressed mood, insomnia, feelings of worthlessness/guilt, hopelessness,  (Hypo) Manic Symptoms:  Impulsivity,  Anxiety  Symptoms:  Excessive Worry,  Psychotic Symptoms:  Denies any hallucinations, delusional thoughts or paranoia.  PTSD Symptoms: Denies any PTSD symptoms or events.  Total Time spent with patient: 1 hour  Past Psychiatric History: Major depressive disorder.  Is the patient at risk to self? Yes.    Has the patient been a risk to self in the past 6 months? Yes.    Has the patient been a risk to self within the distant past? Yes.    Is the patient a risk to others? No.  Has the patient been a risk to others in the past 6 months? No.  Has the patient been a risk to others within the distant past? No.   Prior Inpatient Therapy: No Prior Outpatient Therapy: Yes Caleb Berg)  Alcohol Screening: Patient refused Alcohol Screening Tool: Yes 1. How often do you have a drink containing alcohol?: Never 9. Have you or someone else been injured as a result of your drinking?: No 10. Has a relative or friend or a doctor or another health worker been concerned about your drinking or suggested you cut down?: No Alcohol Use Disorder Identification Test Final Score (AUDIT): 0 Brief Intervention: Yes  Substance Abuse History in the last 12 months:  Yes.    Consequences of Substance Abuse: NA  Previous Psychotropic Medications: Yes, (Unable yo remember).  Psychological Evaluations: Yes   Past Medical History: History reviewed. No pertinent past medical history.  Past Surgical History:  Procedure Laterality Date  . HERNIA REPAIR     Family History: History reviewed. No pertinent  family history. Family Psychiatric  History: Major depression: father & siblings.  Tobacco Screening: Have you used any form of tobacco in the last 30 days? (Cigarettes, Smokeless Tobacco, Cigars, and/or Pipes): No  Social History:  History  Alcohol Use No     History  Drug Use No    Additional Social History: Marital status: Divorced Divorced, when?: Pt got divorced in 2011  What types of issues is patient dealing  with in the relationship?: Pt has no contact with his ex-wife  Additional relationship information: Pt was dating another woman but that relationship ended 3 weeks ago Does patient have children?: Yes How many children?: 1 (Daughter ) How is patient's relationship with their children?: Distant relationship with his daughter since the divorce but pt misses her a lot   Allergies:  No Known Allergies Lab Results:  Results for orders placed or performed during the hospital encounter of 01/12/17 (from the past 48 hour(s))  Hemoglobin A1c     Status: Abnormal   Collection Time: 01/12/17  6:09 AM  Result Value Ref Range   Hgb A1c MFr Bld 6.4 (H) 4.8 - 5.6 %    Comment: (NOTE)         Pre-diabetes: 5.7 - 6.4         Diabetes: >6.4         Glycemic control for adults with diabetes: <7.0    Mean Plasma Glucose 137 mg/dL    Comment: (NOTE) Performed At: [facility] [place], [place] [id number] Lindon Romp MD WV:[id number] Performed at [facility], [place] [address] [place], [place] [address]   Lipid panel     Status: Abnormal   Collection Time: 01/12/17  6:09 AM  Result Value Ref Range   Cholesterol 258 (H) 0 - 200 mg/dL   Triglycerides 190 (H) <150 mg/dL   HDL 60 >40 mg/dL   Total CHOL/HDL Ratio 4.3 RATIO   VLDL 38 0 - 40 mg/dL   LDL Cholesterol 160 (H) 0 - 99 mg/dL    Comment:        Total Cholesterol/HDL:CHD Risk Coronary Heart Disease Risk Table                     Men   Women  1/2 Average Risk   3.4   3.3  Average Risk       5.0   4.4  2 X Average Risk   9.6   7.1  3 X Average Risk  23.4   11.0        Use the calculated Patient Ratio above and the CHD Risk Table to determine the patient's CHD Risk.        ATP III CLASSIFICATION (LDL):  <100     mg/dL   Optimal  100-129  mg/dL   Near or Above                    Optimal  130-159  mg/dL   Borderline  160-189  mg/dL   High  >190     mg/dL   Very High Performed at [facility] [address] [place], [place] [address]   TSH     Status: Abnormal   Collection Time: 01/12/17  6:09 AM  Result Value Ref Range   TSH 6.108 (H) 0.350 - 4.500 uIU/mL    Comment: Performed by a 3rd Generation assay with a functional sensitivity of <=0.01 uIU/mL. Performed at  [facility], [place] [address] [place], [place] [address]   Prolactin     Status: Abnormal   Collection Time: 01/12/17  6:09 AM  Result Value Ref Range   Prolactin 42.2 (H) 4.0 - 15.2 ng/mL    Comment: (NOTE) Performed At: [facility] [place], [place] [id number] Lindon Romp MD [id number] Performed at [facility], [place] [address] [place], [place] [address]    Blood Alcohol level:  Lab Results  Component Value Date   ETH <5 44/12/4740   Metabolic Disorder Labs:  Lab Results  Component Value Date   HGBA1C 6.4 (H) 01/12/2017   MPG 137 01/12/2017   Lab Results  Component Value Date   PROLACTIN 42.2 (H) 01/12/2017   Lab Results  Component Value Date   CHOL 258 (H) 01/12/2017   TRIG 190 (H) 01/12/2017   HDL 60 01/12/2017   CHOLHDL 4.3 01/12/2017   VLDL 38 01/12/2017   LDLCALC 160 (H) 01/12/2017   Current Medications: Current Facility-Administered Medications  Medication Dose Route Frequency Provider Last Rate Last Dose  . acetaminophen (TYLENOL) tablet 650 mg  650 mg Oral Q6H PRN Laverle Hobby, PA-C   650 mg at 01/12/17 1559  . DULoxetine (CYMBALTA) DR capsule 20 mg  20 mg Oral BID Laverle Hobby, PA-C   20 mg at 01/13/17 0850  . hydrOXYzine (ATARAX/VISTARIL) tablet 25 mg  25 mg Oral Q6H PRN Laverle Hobby, PA-C      . magnesium hydroxide (MILK OF MAGNESIA) suspension 30 mL  30 mL Oral Daily PRN Laverle Hobby, PA-C      . traZODone (DESYREL) tablet 50 mg  50 mg Oral QHS,MR X 1 Laverle Hobby, PA-C   50 mg at 01/12/17 2217   PTA Medications: Prescriptions Prior to Admission  Medication Sig Dispense Refill Last  Dose  . hydrOXYzine (ATARAX/VISTARIL) 50 MG tablet Take 1/2-2 tablets as needed for sleep. (Patient taking differently: Take 25-50 mg by mouth at bedtime as needed (sleep). ) 60 tablet 1 Past Week at Unknown time   Musculoskeletal: Strength & Muscle Tone: within normal limits Gait & Station: normal Patient leans: N/A  Psychiatric Specialty Exam: Physical Exam  Constitutional: He appears well-developed.  HENT:  Head: Normocephalic.  Eyes: Pupils are equal, round, and reactive to light.  Neck: Normal range of motion.  Cardiovascular: Normal rate.   GI: Soft.  Genitourinary:  Genitourinary Comments: Deferred  Musculoskeletal: Normal range of motion.  Neurological: He is alert.  Skin: Skin is warm.    Review of Systems  Constitutional: Negative.   HENT: Negative.   Eyes: Negative.   Respiratory: Negative.   Cardiovascular: Negative.   Gastrointestinal: Negative.   Genitourinary: Negative.   Musculoskeletal: Positive for joint pain.  Skin: Negative.   Neurological: Negative.   Endo/Heme/Allergies: Negative.   Psychiatric/Behavioral: Positive for depression. Negative for hallucinations, memory loss, substance abuse and suicidal ideas. The patient is nervous/anxious and has insomnia.     Blood pressure 121/85, pulse 87, temperature 97.8 F (36.6 C), temperature source Oral, resp. rate 18, height 5' 4.25" (1.632 m), weight 74.4 kg (164 lb).Body mass index is 27.93 kg/m.  General Appearance: Casual and Fairly Groomed  Eye Contact:  Good  Speech:  Clear and Coherent and Normal Rate  Volume:  Normal  Mood:  Anxious and Depressed  Affect:  Appropriate  Thought Process:  Coherent, Goal Directed and Linear  Orientation:  Full (Time, Place, and Person)  Thought Content:  Ruminations, however, denies any hallucinations, delusional thoughts or paranoia.  Suicidal Thoughts:  Yes, fleeting thoughts, able to verbally contract for safety.  Homicidal Thoughts:  Denies any thoughts, plans  or intent.  Memory:  Immediate;   Good Recent;   Good Remote;   Good  Judgement:  Fair  Insight:  Present  Psychomotor Activity:  Normal  Concentration:  Concentration: Good and Attention Span: Good  Recall:  Good  Fund of Knowledge:  Good  Language:  Good  Akathisia:  No  Handed:  Right  AIMS (if indicated):     Assets:  Communication Skills Desire for Improvement Social Support  ADL's:  Intact  Cognition:  WNL  Sleep:  Number of Hours: 2.25   Treatment Plan/Recommendations: 1. Admit for crisis management and stabilization, estimated length of stay 3-5 days.  2. Medication management to reduce current symptoms to base line and improve the patient's overall level of functioning: See MAR.  3. Treat health problems as indicated.  4. Develop treatment plan to decrease risk of relapse upon discharge and the need for readmission.  5. Psycho-social education regarding relapse prevention and self care.  6. Health care follow up as needed for medical problems.  7. Review, reconcile, and reinstate any pertinent home medications for other health issues where appropriate. 8. Call for consults with hospitalist for any additional specialty patient care services as needed.  Observation Level/Precautions:  15 minute checks  Laboratory:  Per ED  Psychotherapy: Group Berg.  Medications: See [facility]   Consultations: As needed.  Discharge Concerns: Safety, mood stability  Estimated LOS: 3-5 days  Other: Admit to [facility].   Physician Treatment Plan for Primary Diagnosis: <principal problem not specified> Long Term Goal(s): Improvement in symptoms so as ready for discharge  Short Term Goals: Ability to identify changes in lifestyle to reduce recurrence of condition will improve, Ability to verbalize feelings will improve and Ability to disclose and discuss suicidal ideas  Physician Treatment Plan for Secondary Diagnosis: Active Problems:   MDD (major depressive disorder), recurrent episode,  severe ([place])  Long Term Goal(s): Improvement in symptoms so as ready for discharge  Short Term Goals: Ability to demonstrate self-control will improve, Ability to identify and develop effective coping behaviors will improve, Compliance with prescribed medications will improve and Ability to identify triggers associated with substance abuse/mental health issues will improve  I certify that inpatient services furnished can reasonably be expected to improve the patient's condition.    Encarnacion Slates, NP , PMHNP, FNP-BC 3/29/20189:22 AM

## 2017-01-12 NOTE — Tx Team (Signed)
Initial Treatment Plan 01/12/2017 5:05 AM Caleb Berg GBE:[id number]    PATIENT STRESSORS: Financial difficulties Occupational concerns   PATIENT STRENGTHS: Ability for insight Average or above average intelligence Capable of independent living Communication skills General fund of knowledge Motivation for treatment/growth Supportive family/friends Work skills   PATIENT IDENTIFIED PROBLEMS: "depression"  "find some sort of medium to deal with the outside world, deal with my problem"                   DISCHARGE CRITERIA:  Improved stabilization in mood, thinking, and/or behavior Need for constant or close observation no longer present Verbal commitment to aftercare and medication compliance  PRELIMINARY DISCHARGE PLAN: Attend aftercare/continuing care group Outpatient therapy Return to previous living arrangement  PATIENT/FAMILY INVOLVEMENT: This treatment plan has been presented to and reviewed with the patient, Caleb Berg, and/or family member.  The patient and family have been given the opportunity to ask questions and make suggestions.  Zoe Lan, RN 01/12/2017, 5:05 AM

## 2017-01-13 LAB — HEMOGLOBIN A1C
HEMOGLOBIN A1C: 6.4 % — AB (ref 4.8–5.6)
MEAN PLASMA GLUCOSE: 137 mg/dL

## 2017-01-13 LAB — PROLACTIN: PROLACTIN: 42.2 ng/mL — AB (ref 4.0–15.2)

## 2017-01-13 LAB — T4, FREE: FREE T4: 0.73 ng/dL (ref 0.61–1.12)

## 2017-01-13 MED ORDER — TRAZODONE HCL 100 MG PO TABS
100.0000 mg | ORAL_TABLET | Freq: Every evening | ORAL | Status: DC | PRN
  Administered 2017-01-13 – 2017-01-14 (×2): 100 mg via ORAL
  Filled 2017-01-13: qty 7
  Filled 2017-01-13 (×2): qty 1

## 2017-01-13 NOTE — Progress Notes (Signed)
[facility] MD Progress Note  01/13/2017 4:41 PM Caleb Berg  MRN:  [id number] Subjective:  Patient reports he is feeling partially better than on admission. At this time denies suicidal ideations . Denies medication side effects. Objective : I have discussed case with treatment team and have met with patient . Patient presents with some improvement- less severely depressed, and with a more reactive affect. No current suicidal ideations. Visible on unit, going to groups, behavior on unit in good control, pleasant on approach. Noticed to use humor during session.  Continues to ruminate about stressors, mainly being unemployed at this time, financial concerns. Tolerating medications well .  Principal Problem:  MDD  Diagnosis:   Patient Active Problem List   Diagnosis Date Noted  . MDD (major depressive disorder), recurrent episode, severe ([place]) [F33.2] 01/12/2017   Total Time spent with patient: 30 minutes   Past Medical History: History reviewed. No pertinent past medical history.  Past Surgical History:  Procedure Laterality Date  . HERNIA REPAIR     Family History: History reviewed. No pertinent family history.  Social History:  History  Alcohol Use No     History  Drug Use No    Social History   Social History  . Marital status: Married    Spouse name: N/A  . Number of children: N/A  . Years of education: N/A   Social History Main Topics  . Smoking status: Never Smoker  . Smokeless tobacco: Never Used  . Alcohol use No  . Drug use: No  . Sexual activity: Not Currently    Birth control/ protection: Abstinence   Other Topics Concern  . None   Social History Narrative  . None   Additional Social History:   Sleep: Fair  Appetite:  Good  Current Medications: Current Facility-Administered Medications  Medication Dose Route Frequency Provider Last Rate Last Dose  . acetaminophen (TYLENOL) tablet 650 mg  650 mg Oral Q6H PRN Laverle Hobby, PA-C   650 mg at  01/12/17 1559  . DULoxetine (CYMBALTA) DR capsule 20 mg  20 mg Oral BID Laverle Hobby, PA-C   20 mg at 01/13/17 0850  . hydrOXYzine (ATARAX/VISTARIL) tablet 25 mg  25 mg Oral Q6H PRN Laverle Hobby, PA-C      . magnesium hydroxide (MILK OF MAGNESIA) suspension 30 mL  30 mL Oral Daily PRN Laverle Hobby, PA-C      . traZODone (DESYREL) tablet 50 mg  50 mg Oral QHS,MR X 1 Laverle Hobby, PA-C   50 mg at 01/12/17 2217    Lab Results:  Results for orders placed or performed during the hospital encounter of 01/12/17 (from the past 48 hour(s))  Hemoglobin A1c     Status: Abnormal   Collection Time: 01/12/17  6:09 AM  Result Value Ref Range   Hgb A1c MFr Bld 6.4 (H) 4.8 - 5.6 %    Comment: (NOTE)         Pre-diabetes: 5.7 - 6.4         Diabetes: >6.4         Glycemic control for adults with diabetes: <7.0    Mean Plasma Glucose 137 mg/dL    Comment: (NOTE) Performed At: [facility] [place], [place] [id number] Lindon Romp MD OE:[id number] Performed at [facility], [place] [address] [place], [place] [address]   Lipid panel     Status: Abnormal   Collection Time: 01/12/17  6:09 AM  Result  Value Ref Range   Cholesterol 258 (H) 0 - 200 mg/dL   Triglycerides 190 (H) <150 mg/dL   HDL 60 >40 mg/dL   Total CHOL/HDL Ratio 4.3 RATIO   VLDL 38 0 - 40 mg/dL   LDL Cholesterol 160 (H) 0 - 99 mg/dL    Comment:        Total Cholesterol/HDL:CHD Risk Coronary Heart Disease Risk Table                     Men   Women  1/2 Average Risk   3.4   3.3  Average Risk       5.0   4.4  2 X Average Risk   9.6   7.1  3 X Average Risk  23.4   11.0        Use the calculated Patient Ratio above and the CHD Risk Table to determine the patient's CHD Risk.        ATP III CLASSIFICATION (LDL):  <100     mg/dL   Optimal  100-129  mg/dL   Near or Above                    Optimal  130-159  mg/dL   Borderline  160-189  mg/dL   High  >190     mg/dL   Very  High Performed at [place] [address], [place], [place] [address]   TSH     Status: Abnormal   Collection Time: 01/12/17  6:09 AM  Result Value Ref Range   TSH 6.108 (H) 0.350 - 4.500 uIU/mL    Comment: Performed by a 3rd Generation assay with a functional sensitivity of <=0.01 uIU/mL. Performed at [facility], [place] [address], [place], [place] [address]   Prolactin     Status: Abnormal   Collection Time: 01/12/17  6:09 AM  Result Value Ref Range   Prolactin 42.2 (H) 4.0 - 15.2 ng/mL    Comment: (NOTE) Performed At: [facility] [place], [place] [id number] Lindon Romp MD RA:[id number] Performed at [facility], [place] [address], [place], [place] [address]   T4, free     Status: None   Collection Time: 01/13/17  6:13 AM  Result Value Ref Range   Free T4 0.73 0.61 - 1.12 ng/dL    Comment: (NOTE) Biotin ingestion may interfere with free T4 tests. If the results are inconsistent with the TSH level, previous test results, or the clinical presentation, then consider biotin interference. If needed, order repeat testing after stopping biotin. Performed at [facility], [place] [address], [place], [place] [address]     Blood Alcohol level:  Lab Results  Component Value Date   [facility] <5 73/42/8768    Metabolic Disorder Labs: Lab Results  Component Value Date   HGBA1C 6.4 (H) 01/12/2017   MPG 137 01/12/2017   Lab Results  Component Value Date   PROLACTIN 42.2 (H) 01/12/2017   Lab Results  Component Value Date   CHOL 258 (H) 01/12/2017   TRIG 190 (H) 01/12/2017   HDL 60 01/12/2017   CHOLHDL 4.3 01/12/2017   VLDL 38 01/12/2017   LDLCALC 160 (H) 01/12/2017    Physical Findings: AIMS: Facial and Oral Movements Muscles of Facial Expression: None, normal Lips and Perioral Area: None, normal Jaw: None, normal Tongue: None, normal,Extremity Movements Upper (arms, wrists, hands, fingers):  None, normal Lower (legs, knees, ankles,  toes): None, normal, Trunk Movements Neck, shoulders, hips: None, normal, Overall Severity Severity of abnormal movements (highest score from questions above): None, normal Incapacitation due to abnormal movements: None, normal Patient's awareness of abnormal movements (rate only patient's report): No Awareness, Dental Status Current problems with teeth and/or dentures?: No Does patient usually wear dentures?: No  CIWA:  CIWA-Ar Total: 0 COWS:     Musculoskeletal: Strength & Muscle Tone: within normal limits Gait & Station: normal Patient leans: N/A  Psychiatric Specialty Exam: Physical Exam  ROS denies headache, no chest pain, no shortness of breath, no vomiting   Blood pressure 121/85, pulse 87, temperature 97.8 F (36.6 C), temperature source Oral, resp. rate 18, height 5' 4.25" (1.632 m), weight 74.4 kg (164 lb).Body mass index is 27.93 kg/m.  General Appearance: Well Groomed  Eye Contact:  Good  Speech:  Normal Rate  Volume:  Normal  Mood:  reports feeling partially better , less depressed   Affect:  appropriate, reactive,  Thought Process:  Linear and Descriptions of Associations: Intact  Orientation:  Full (Time, Place, and Person)  Thought Content:  denies hallucinations, no delusions, not internally preoccupied   Suicidal Thoughts:  No at this time denies suicidal or self injurious ideations, denies any homicidal or violent ideations  Homicidal Thoughts:  No  Memory:  recent and remote grossly intact   Judgement:  Other:  improving  Insight:  improving  Psychomotor Activity:  Normal  Concentration:  Concentration: Good and Attention Span: Good  Recall:  Good  Fund of Knowledge:  Good  Language:  Good  Akathisia:  Negative  Handed:  Right  AIMS (if indicated):     Assets:  Desire for Improvement Resilience  ADL's:  Intact  Cognition:  WNL  Sleep:  Number of Hours: 2.25   Assessment - patient reports feeling better  compared to admission, less severely depressed, and presents with fuller range of affect. Denies suicidal ideations at this time. Denies medication side effects, is tolerating Cymbalta  trial well thus far   Treatment Plan Summary: Daily contact with patient to assess and evaluate symptoms and progress in treatment, Medication management, Plan inpatient treatment  and medications as below Encourage group and milieu participation to work on coping skills and symptom reduction Continue Cymbalta 20 mgrs BID for depression, anxiety  Continue Vistaril 25 mgrs Q 6 hours PRN for anxiety  Increase Trazodone to 100 mgrs QHS PRN for insomnia Treatment team working on disposition planning options Jenne Campus, MD 01/13/2017, 4:41 PM

## 2017-01-13 NOTE — Progress Notes (Signed)
Pt goes into another pt's room. Staff inform pt not allow in other pt's room pt immediately came out of the room. RN notify

## 2017-01-13 NOTE — Progress Notes (Signed)
Patient ID: Caleb Berg, male   DOB: October 17, 1951, 66 y.o.   MRN: [id number] D: Client visible on the unit interacting with peers in [place]. Client reports or his day "going better, getting in a more happy place" "brother just came to see me" "great group, this talked about how he had been clean for twelve years." depression "3" of 10. A: Writer provided emotional support, encouraged client to report any concerns, continue to be active in treatment. Medications reviewed, administered as ordered. Staff will monitor q67min for safety. R: Client is safe on the unit, attended karaoke.

## 2017-01-13 NOTE — Progress Notes (Signed)
D: Patient has been observed in the milieu.  He walks the hallway frequently and was jogging outside during recreation.  He reports good sleep and appetite.  His energy level is normal and his concentration is good.  He rates his depression, hopelessness and anxiety as a 4.  His goal today is to "pick up my pace on my jogging."  Patient is attending groups with minimal participation.  He denies any thoughts of self harm.  He denies HI and does not appear to be responding to internal stimuli. A: Continue to monitor medication management and MD orders.  Safety checks continued every 15 minutes per protocol.  Offer support and encouragement as needed. R: Patient is receptive to staff; his behavior is appropriate.

## 2017-01-13 NOTE — Progress Notes (Signed)
  D:Pt appeared to be anxious, giving short answers. When asked about his day pt stated,"doing much better". However, pt explained that he was worrying about his bills because he wasn't making enough money at work. Stated, "a lady came here and spoke for almost 2 hours and the only thing I can remember is about [place] Sessions, and getting a Church to help me with bills". Pt stated, "[facility] doesn't do that, and I don't know how long the [facility] will be able to help". Stated, "there's nothing long term". Pt has no other questions or concerns.    A:  Support and encouragement was offered. 15 min checks continued for safety.  R: Pt remains safe.

## 2017-01-13 NOTE — Progress Notes (Signed)
Pt attend wrap up group. His day was a 8. Pt said his goal exercise and to unwine from a long night,. He had the chance to go to the gym and this help him a lot.

## 2017-01-13 NOTE — Progress Notes (Signed)
Adult Psychoeducational Group Note  Date:  01/13/2017 Time:  0900 am  Group Topic/Focus:  Orientation:   The focus of this group is to educate the patient on the purpose and policies of crisis stabilization and provide a format to answer questions about their admission.  The group details unit policies and expectations of patients while admitted.  Participation Level:  Active  Participation Quality:  Appropriate  Affect:  Appropriate  Cognitive:  Appropriate  Insight: Appropriate  Engagement in Group:  Engaged  Modes of Intervention:  Discussion, Education and Orientation  Additional Comments:    Kaushik Maul L 01/13/2017, 9:32 AM

## 2017-01-13 NOTE — BHH Group Notes (Signed)
[facility] Group Therapy 01/13/2017 1:15pm  Type of Therapy: Mental Health Association Presentation  Participation Level: Active  Participation Quality: Attentive  Affect: Appropriate  Cognitive: Oriented  Insight: Developing/Improving  Engagement in Therapy: Engaged  Modes of Intervention: Discussion, Education and Socialization  Summary of Progress/Problems: Mental Health Association ([place]) Speaker came to talk about his personal journey with substance abuse and addiction. The pt processed ways by which to relate to the speaker. [place] speaker provided handouts and educational information pertaining to groups and services offered by the [facility]. Pt was engaged in speaker's presentation and was receptive to resources provided.    Kara Mead. Marshell Levan, MSW, [place] 01/13/2017 5:07 PM

## 2017-01-14 LAB — T3, FREE: T3 FREE: 3.3 pg/mL (ref 2.0–4.4)

## 2017-01-14 NOTE — Progress Notes (Signed)
[facility] MD Progress Note  01/14/2017 1:25 PM Caleb Berg  MRN:  [id number]   Subjective:  Patient reports " I doing fine" - patient is inquiring about discharge plan  Objective: Caleb Berg is awake, alert and oriented *3. Seen resting in bedroom.   Denies suicidal or homicidal ideation. Denies auditory or visual hallucination and does not appear to be responding to internal stimuli.  Patient reports he is medication compliant without mediation side effects. Patient reports attending group session states he is resting today due to a pull muscle after playing basketball on yesterday. States his is experiencing  mild depression 2/10. Reports good appetite and reports he is  resting well.  Support, encouragement and reassurance was provided.   Principal Problem:  MDD  Diagnosis:   Patient Active Problem List   Diagnosis Date Noted  . MDD (major depressive disorder), recurrent episode, severe ([place]) [F33.2] 01/12/2017   Total Time spent with patient: 30 minutes   Past Medical History: History reviewed. No pertinent past medical history.  Past Surgical History:  Procedure Laterality Date  . HERNIA REPAIR     Family History: History reviewed. No pertinent family history.  Social History:  History  Alcohol Use No     History  Drug Use No    Social History   Social History  . Marital status: Married    Spouse name: N/A  . Number of children: N/A  . Years of education: N/A   Social History Main Topics  . Smoking status: Never Smoker  . Smokeless tobacco: Never Used  . Alcohol use No  . Drug use: No  . Sexual activity: Not Currently    Birth control/ protection: Abstinence   Other Topics Concern  . None   Social History Narrative  . None   Additional Social History:   Sleep: Fair  Appetite:  Good  Current Medications: Current Facility-Administered Medications  Medication Dose Route Frequency Provider Last Rate Last Dose  . acetaminophen (TYLENOL) tablet 650 mg  650  mg Oral Q6H PRN Laverle Hobby, PA-C   650 mg at 01/12/17 1559  . DULoxetine (CYMBALTA) DR capsule 20 mg  20 mg Oral BID Laverle Hobby, PA-C   20 mg at 01/14/17 9563  . hydrOXYzine (ATARAX/VISTARIL) tablet 25 mg  25 mg Oral Q6H PRN Laverle Hobby, PA-C      . magnesium hydroxide (MILK OF MAGNESIA) suspension 30 mL  30 mL Oral Daily PRN Laverle Hobby, PA-C      . traZODone (DESYREL) tablet 100 mg  100 mg Oral QHS PRN Jenne Campus, MD   100 mg at 01/13/17 2152    Lab Results:  Results for orders placed or performed during the hospital encounter of 01/12/17 (from the past 48 hour(s))  T4, free     Status: None   Collection Time: 01/13/17  6:13 AM  Result Value Ref Range   Free T4 0.73 0.61 - 1.12 ng/dL    Comment: (NOTE) Biotin ingestion may interfere with free T4 tests. If the results are inconsistent with the TSH level, previous test results, or the clinical presentation, then consider biotin interference. If needed, order repeat testing after stopping biotin. Performed at [facility], [place] [address] [place], [place] [address]   T3, free     Status: None   Collection Time: 01/13/17  6:13 AM  Result Value Ref Range   T3, Free 3.3 2.0 - 4.4 pg/mL    Comment: (NOTE) Performed At:  [facility] [place], [place] [id number] Lindon Romp MD YD:[id number] Performed at [facility], [place] [address] [place], [place] [address]     Blood Alcohol level:  Lab Results  Component Value Date   ETH <5 70/96/2836    Metabolic Disorder Labs: Lab Results  Component Value Date   HGBA1C 6.4 (H) 01/12/2017   MPG 137 01/12/2017   Lab Results  Component Value Date   PROLACTIN 42.2 (H) 01/12/2017   Lab Results  Component Value Date   CHOL 258 (H) 01/12/2017   TRIG 190 (H) 01/12/2017   HDL 60 01/12/2017   CHOLHDL 4.3 01/12/2017   VLDL 38 01/12/2017   LDLCALC 160 (H) 01/12/2017    Physical Findings: AIMS: Facial and Oral  Movements Muscles of Facial Expression: None, normal Lips and Perioral Area: None, normal Jaw: None, normal Tongue: None, normal,Extremity Movements Upper (arms, wrists, hands, fingers): None, normal Lower (legs, knees, ankles, toes): None, normal, Trunk Movements Neck, shoulders, hips: None, normal, Overall Severity Severity of abnormal movements (highest score from questions above): None, normal Incapacitation due to abnormal movements: None, normal Patient's awareness of abnormal movements (rate only patient's report): No Awareness, Dental Status Current problems with teeth and/or dentures?: No Does patient usually wear dentures?: No  CIWA:  CIWA-Ar Total: 0 COWS:     Musculoskeletal: Strength & Muscle Tone: within normal limits Gait & Station: normal Patient leans: N/A  Psychiatric Specialty Exam: Physical Exam  Nursing note and vitals reviewed. Constitutional: He is oriented to person, place, and time.  Neurological: He is alert and oriented to person, place, and time.  Psychiatric: He has a normal mood and affect. His behavior is normal.    Review of Systems  Psychiatric/Behavioral: Positive for depression. The patient is nervous/anxious.    denies headache, no chest pain, no shortness of breath, no vomiting   Blood pressure 121/84, pulse 78, temperature 98.1 F (36.7 C), temperature source Oral, resp. rate 18, height 5' 4.25" (1.632 m), weight 74.4 kg (164 lb).Body mass index is 27.93 kg/m.  General Appearance: Well Groomed  Eye Contact:  Good  Speech:  Normal Rate  Volume:  Normal  Mood:  Euthymic  Affect:  Appropriate and Congruent  Thought Process:  Coherent and Linear  Orientation:  Full (Time, Place, and Person)  Thought Content:  Hallucinations: None  Suicidal Thoughts:  No   Homicidal Thoughts:  No  Memory:  recent and remote grossly intact   Judgement:  Other:  improving  Insight:  improving  Psychomotor Activity:  Normal  Concentration:  Concentration:  Good and Attention Span: Good  Recall:  Good  Fund of Knowledge:  Good  Language:  Good  Akathisia:  Negative  Handed:  Right  AIMS (if indicated):     Assets:  Communication Skills Desire for Improvement Resilience Social Support  ADL's:  Intact  Cognition:  WNL  Sleep:  Number of Hours: 6    I agree with current treatment plan on 01/14/2017, Patient seen face-to-face for psychiatric evaluation follow-up, chart reviewed. Reviewed the information documented and agree with the treatment plan.  Treatment Plan Summary: Daily contact with patient to assess and evaluate symptoms and progress in treatment, Medication management, Plan inpatient treatment  and medications as below  Encourage group and milieu participation to work on coping skills and symptom reduction Continue Cymbalta 20 mgrs BID for depression, anxiety  Continue Vistaril 25 mgrs Q 6 hours PRN for anxiety  Continue Trazodone to 100  mgrs QHS PRN for insomnia Treatment team working on disposition planning options  Derrill [place], NP 01/14/2017, 1:25 PM

## 2017-01-14 NOTE — Progress Notes (Signed)
D: Pt denies SI/HI/AVH. Pt is pleasant and cooperative. 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 A: Pt was offered support and encouragement. Pt was given scheduled medications. Pt was encourage to attend groups. Q 15 minute checks were done for safety.  R:Pt attends groups and interacts well with peers and staff. Pt is taking medication. Pt has no complaints.Pt receptive to treatment and safety maintained on unit.

## 2017-01-14 NOTE — Plan of Care (Signed)
Problem: Coping: Goal: Ability to verbalize feelings will improve Outcome: Progressing Ability to verbalize feeling will improve AEB client's reports of day "going better" "getting more hopeful"

## 2017-01-14 NOTE — Progress Notes (Signed)
Caleb Berg is seen standing at the med window.he makes direct eye contact with Probation officer, he is cooperative and he hesitates as Probation officer scans his 8am cymbalta as he says " well now, tell me...which one is this one?". A Writer explains reason for cymbalta, as well as potential side effects. Pt responds " oh well...that' right.[name] remember they said I was starting a new med"" did I take this yesterday?" Writer explained to pt that med documented as given to him yesterday. HE is visibly nervous as he stands in front of Actuary. HE completes his daily assessment and o tis he writes he denies SI today and he rates his depression, hopelessness and axnetiy " 2/2/0", respectively. Writer attempted to engage pt in therapeutic conversation and pt is minimalistic in his conversation, saying " it's really nothing.[name] expect to go home tomorrow". R pt encouraged to Museum/gallery exhibitions officer . Safety in place.

## 2017-01-14 NOTE — Progress Notes (Signed)
Patient attended the evening group session and answered all discussion prompts from the writer. 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 Patient rated his day a 10 out of 10 and his affect was appropriate.

## 2017-01-14 NOTE — BHH Suicide Risk Assessment (Signed)
[facility] INPATIENT:  Family/Significant Other Suicide Prevention Education  Suicide Prevention Education:  Education Completed; Caliph Borowiak (brother [phone]), has been identified by the patient as the family member/significant other with whom the patient will be residing, and identified as the person(s) who will aid the patient in the event of a mental health crisis (suicidal ideations/suicide attempt).  With written consent from the patient, the family member/significant other has been provided the following suicide prevention education, prior to the and/or following the discharge of the patient.  The suicide prevention education provided includes the following:  Suicide risk factors  Suicide prevention and interventions  National Suicide Hotline telephone number  [facility] assessment telephone number  [facility] Emergency Assistance [place] and/or Residential Mobile Crisis Unit telephone number  Request made of family/significant other to:  Remove weapons (e.g., guns, rifles, knives), all items previously/currently identified as safety concern.    Remove drugs/medications (over-the-counter, prescriptions, illicit drugs), all items previously/currently identified as a safety concern.  The family member/significant other verbalizes understanding of the suicide prevention education information provided.  The family member/significant other agrees to remove the items of safety concern listed above.  Pt's brother is concerned that pt does not have the money to pay for his bills. Pt sent an e-mail to his family before he came into the hospital stating that he attempted suicide and was "broke". Pt's family is willing to help pt out in some ways but they are not able to financially support the pt.  Georga Kaufmann 01/14/2017, 11:39 AM

## 2017-01-14 NOTE — Progress Notes (Signed)
Recreation Therapy Notes  Date: 01/14/17 Time: 0930 Location: 300 [address] Dayroom  Group Topic: Stress Management  Goal Area(s) Addresses:  Patient will verbalize importance of using healthy stress management.  Patient will identify positive emotions associated with healthy stress management.   Intervention: Stress Management  Activity :  Self Forgiveness.  LRT introduced the stress management technique of meditation.  LRT played a meditation focused on self forgiveness allowing patients to participate in the technique.  Patients were to follow along as the meditation played to engage in the technique.  Education:  Stress Management, Discharge Planning.   Education Outcome: Acknowledges edcuation/In group clarification offered/Needs additional education  Clinical Observations/Feedback: Pt did not attend group.   Victorino Sparrow, LRT/CTRS         Ria Comment, Kanyia Heaslip A 01/14/2017 11:59 AM

## 2017-01-14 NOTE — BHH Group Notes (Signed)
[facility] LCSW Aftercare Discharge Planning Group Note   01/14/2017 10:13 AM  Participation Quality:  Invited. Did not attend.   Georga Kaufmann, MSW, Latanya Presser

## 2017-01-14 NOTE — BHH Group Notes (Signed)
[place] LCSW Group Therapy 01/14/2017 1:15pm  Type of Therapy: Group Therapy- Feelings Around Relapse and Recovery  Participation Level: Active   Participation Quality:  Appropriate  Affect:  Appropriate  Cognitive: Alert and Oriented   Insight:  Developing   Engagement in Therapy: Developing/Improving and Engaged   Modes of Intervention: Clarification, Confrontation, Discussion, Education, Exploration, Limit-setting, Orientation, Problem-solving, Rapport Building, Art therapist, Socialization and Support  Summary of Progress/Problems: The topic for today was feelings about relapse. The group discussed what relapse prevention is to them and identified triggers that they are on the path to relapse. Members also processed their feeling towards relapse and were able to relate to common experiences. Group also discussed coping skills that can be used for relapse prevention. Pt states that his plan for preventing relapse is to get a job. Pt reports that if he has stable income it will help to decrease a lot of depression.  Therapeutic Modalities:   Cognitive Behavioral Therapy Solution-Focused Therapy Assertiveness Training Relapse Prevention Therapy   Georga Kaufmann, MSW, Latanya Presser [phone] 01/14/2017 2:53 PM

## 2017-01-15 MED ORDER — HYDROXYZINE HCL 25 MG PO TABS: 25.0000 mg | ORAL_TABLET | Freq: Four times a day (QID) | ORAL | 0 refills | Status: DC | PRN

## 2017-01-15 MED ORDER — TRAZODONE HCL 100 MG PO TABS: 100.0000 mg | ORAL_TABLET | Freq: Every evening | ORAL | 0 refills | Status: DC | PRN

## 2017-01-15 MED ORDER — DULOXETINE HCL 20 MG PO CPEP: 20.0000 mg | ORAL_CAPSULE | Freq: Two times a day (BID) | ORAL | 0 refills | Status: DC

## 2017-01-15 NOTE — Progress Notes (Addendum)
  [facility] Adult Case Management Discharge Plan :  Will you be returning to the same living situation after discharge:  Yes,  alone At discharge, do you have transportation home?: Yes,  arranged Do you have the ability to pay for your medications: No.  States that this may be a barrier  Release of information consent forms completed and in the chart;  Patient's signature needed at discharge.  Patient to Follow up at: Follow-up Information    [facility] Follow up.   Specialty:  Behavioral Health Why:  Please go for a walk-in appointment within 1-3 days of discharge. Walk-in hours are Mon-Fri 8AM-3PM. Please arrive as early as possible to be sure that you are seen. Contact information: [place] [address] [phone]        [facility] Follow up.   Specialty:  Behavioral Health Why:  4/11 at 10:00am with Beth. Please arrive at 9:00am to complete new patient paperwork.   5/17 for medication management with Dr. Daron Offer. Please arrive at 1:55pm. Contact information: [place] [phone]          Next level of care provider has access to [facility] and Suicide Prevention discussed: Yes,  with patient and brother  Have you used any form of tobacco in the last 30 days? (Cigarettes, Smokeless Tobacco, Cigars, and/or Pipes): No  Has patient been referred to the Quitline?: N/A patient is not a smoker  Patient has been referred for addiction treatment: N/A  Caleb Berg 01/15/2017, 12:48 PM

## 2017-01-15 NOTE — Discharge Summary (Signed)
Physician Discharge Summary Note  Patient:  Caleb Berg is an 66 y.o., male MRN:  [id number] DOB:  04/14/1951 Patient phone:  [phone] (home)  Patient address:   [address] [place] [place] [address],  Total Time spent with patient: Greater than 30 minutes  Date of Admission:  01/12/2017 Date of Discharge: 01-15-17  Reason for Admission: Worsening symptoms of depression triggering suicidal ideations with plans.  Principal Problem: Major depressive disorder, recurrent episodes, severe.  Discharge Diagnoses: Patient Active Problem List   Diagnosis Date Noted  . MDD (major depressive disorder), recurrent episode, severe ([place]) [F33.2] 01/12/2017   Past Psychiatric History: Major depressive disorder, recurrent  Past Medical History: History reviewed. No pertinent past medical history.  Past Surgical History:  Procedure Laterality Date  . HERNIA REPAIR     Family History: History reviewed. No pertinent family history.  Family Psychiatric  History: See H&P  Social History:  History  Alcohol Use No     History  Drug Use No    Social History   Social History  . Marital status: Married    Spouse name: N/A  . Number of children: N/A  . Years of education: N/A   Social History Main Topics  . Smoking status: Never Smoker  . Smokeless tobacco: Never Used  . Alcohol use No  . Drug use: No  . Sexual activity: Not Currently    Birth control/ protection: Abstinence   Other Topics Concern  . None   Social History Narrative  . None   Hospital Course: This is an admission assessment for this 66 year old Caucasian male with hx of depression. Admitted to the [facility] adult unit from the [facility] with complaints of worsening symptoms of depression. He came to the hospital for mood stabilization treatment. This is his first inpatient psychiatric hospitalization. During this assessment, Caleb Berg reports, "I drove to the hospital yesterday afternoon. I'm depressed,  out of job, stressing on how to pay my bills. Then, my divorce in 2011 hit me hard. I believe, that was when the depression set in. I have a daughter that did not have much to do with me. I have been out of job x 3 weeks. I was kind of depressed prior to the job loss, only that it worsened the depression. I was started on depression medicines 2 & 1/2 weeks ago by [facility], but I did not take them. If I take the medicine, it may help the depression & yet, I'm still out of job. I guess if I get better by taking the medications, I will then be happily broke. I'm trying to get my life on the right tract, then find another job. My siblings are trying to help me out now. I was suicidal, I attempted suicide twice in two weeks by overdose on sleeping pills. It did not kill me because I'm still here. I will like to get back on depression medicines again while I'm here".   Caleb Berg was admitted to the [facility] adult unit with complaints of worsening symptoms of depression triggering suicidal ideations with plan to overdose on medications. He stated on admission that he had attempted suicide x 2 within the last 2 weeks prior to his admission. He cited lack of employment, strained relationship with an only daughter & financial related stressors as the trigger. He was in need of mood stabilization treatments.   During the course of his hospitalization, Caleb Berg was medicated & discharged on, Duloxetine 20 mg for depression,  Hydroxyzine 25 mg prn for anxiety & Trazodone 100 mg insomnia. He was enrolled & participated in the group counseling sessions being offered & held on this unit. He was counseled & learned coping skills that should help him cope better & maintain mood stability after discharge. Caleb Berg presented no other significant previously existing medical issues that required treatments or monitoring. He tolerated his treatment regimen without any adverse effects reported. While his treatment was on going, Caleb Berg's improvement was  monitored by observation & his daily reports of symptom reduction noted.          Caleb Berg was evaluated daily by the treatment team for mood stability & the need for continued recovery after discharge. His motivation was an integral factor in his recovery & mood stability. He was offered further treatment options upon discharge & will follow up with the outpatient psychiatric services as listed below.     Upon discharge, Caleb Berg was both mentally & medically stable. He is currently denying suicidal, homicidal ideation, auditory, visual/tactile hallucinations, delusional thoughts & or paranoia. He was provided with a 7 days worth, supply samples of her [facility] discharge medications. He left [facility] with all personal belongings in no apparent distress. Transportation per his arrangement.    Physical Findings: AIMS: Facial and Oral Movements Muscles of Facial Expression: None, normal Lips and Perioral Area: None, normal Jaw: None, normal Tongue: None, normal,Extremity Movements Upper (arms, wrists, hands, fingers): None, normal Lower (legs, knees, ankles, toes): None, normal, Trunk Movements Neck, shoulders, hips: None, normal, Overall Severity Severity of abnormal movements (highest score from questions above): None, normal Incapacitation due to abnormal movements: None, normal Patient's awareness of abnormal movements (rate only patient's report): No Awareness, Dental Status Current problems with teeth and/or dentures?: No Does patient usually wear dentures?: No  CIWA:  CIWA-Ar Total: 0 COWS:     Musculoskeletal: Strength & Muscle Tone: within normal limits Gait & Station: normal Patient leans: N/A  Psychiatric Specialty Exam: Physical Exam  Constitutional: He appears well-developed.  HENT:  Head: Normocephalic.  Eyes: Pupils are equal, round, and reactive to light.  Neck: Normal range of motion.  Cardiovascular: Normal rate.   Respiratory: Effort normal.  GI: Soft.  Genitourinary:   Genitourinary Comments: Denies any issues in this area  Musculoskeletal: Normal range of motion.  Neurological: He is alert.  Skin: Skin is warm.    Review of Systems  Constitutional: Negative.   HENT: Negative.   Eyes: Negative.   Respiratory: Negative.   Cardiovascular: Negative.   Gastrointestinal: Negative.   Genitourinary: Negative.   Musculoskeletal: Negative.   Skin: Negative.   Neurological: Negative.   Endo/Heme/Allergies: Negative.   Psychiatric/Behavioral: Positive for depression (Stable). Negative for hallucinations, memory loss, substance abuse and suicidal ideas. The patient has insomnia (Stable). The patient is not nervous/anxious.     Blood pressure 108/81, pulse 89, temperature 97.5 F (36.4 C), temperature source Oral, resp. rate 18, height 5' 4.25" (1.632 m), weight 74.4 kg (164 lb).Body mass index is 27.93 kg/m.  See Md's SRA   Have you used any form of tobacco in the last 30 days? (Cigarettes, Smokeless Tobacco, Cigars, and/or Pipes): No  Has this patient used any form of tobacco in the last 30 days? (Cigarettes, Smokeless Tobacco, Cigars, and/or Pipes)No  Blood Alcohol level:  Lab Results  Component Value Date   ETH <5 19/37/9024   Metabolic Disorder Labs:  Lab Results  Component Value Date   HGBA1C 6.4 (H) 01/12/2017   MPG 137 01/12/2017  Lab Results  Component Value Date   PROLACTIN 42.2 (H) 01/12/2017   Lab Results  Component Value Date   CHOL 258 (H) 01/12/2017   TRIG 190 (H) 01/12/2017   HDL 60 01/12/2017   CHOLHDL 4.3 01/12/2017   VLDL 38 01/12/2017   LDLCALC 160 (H) 01/12/2017   See Psychiatric Specialty Exam and Suicide Risk Assessment completed by Attending Physician prior to discharge.  Discharge destination:  Home  Is patient on multiple antipsychotic therapies at discharge:  No   Has Patient had three or more failed trials of antipsychotic monotherapy by history:  No  Recommended Plan for Multiple Antipsychotic  Therapies: NA  Allergies as of 01/15/2017   No Known Allergies     Medication List    TAKE these medications     Indication  DULoxetine 20 MG capsule Commonly known as:  CYMBALTA Take 1 capsule (20 mg total) by mouth 2 (two) times daily. For depression  Indication:  Major Depressive Disorder   hydrOXYzine 25 MG tablet Commonly known as:  ATARAX/VISTARIL Take 1 tablet (25 mg total) by mouth every 6 (six) hours as needed for anxiety. What changed:  medication strength  how much to take  how to take this  when to take this  reasons to take this  additional instructions  Indication:  Anxiety Neurosis   traZODone 100 MG tablet Commonly known as:  DESYREL Take 1 tablet (100 mg total) by mouth at bedtime as needed for sleep.  Indication:  Trouble Sleeping      Follow-up Information    [facility] Follow up.   Specialty:  Behavioral Health Why:  Please go for a walk-in appointment within 1-3 days of discharge. Walk-in hours are Mon-Fri 8AM-3PM. Please arrive as early as possible to be sure that you are seen. Contact information: [place] [address] [phone]        [facility] Follow up.   Why:  Social worker has left a message with the receptionist. If social worker is unable to get an appointment scheduled, please call the office on the day that you discharge and schedule an appointment. Thank you. Contact information: [address] [place], [place] [address] P: [phone] F: [phone]         Follow-up recommendations: Activity:  As tolerated Diet: As recommended by your primary care doctor. Keep all scheduled follow-up appointments as recommended.   Comments: Patient is instructed prior to discharge to: Take all medications as prescribed by his/her mental healthcare provider. Report any adverse effects and or reactions from the medicines to his/her outpatient provider promptly. Patient has been instructed & cautioned: To  not engage in alcohol and or illegal drug use while on prescription medicines. In the event of worsening symptoms, patient is instructed to call the crisis hotline, 911 and or go to the nearest ED for appropriate evaluation and treatment of symptoms. To follow-up with his/her primary care provider for your other medical issues, concerns and or health care needs.   Signed: Encarnacion Slates, NP, PMHNP, FNP-BC 01/15/2017, 9:19 AM

## 2017-01-15 NOTE — BHH Suicide Risk Assessment (Signed)
[facility] Discharge Suicide Risk Assessment   Principal Problem: MDD Discharge Diagnoses:  Patient Active Problem List   Diagnosis Date Noted  . MDD (major depressive disorder), recurrent episode, severe ([place]) [F33.2] 01/12/2017    Total Time spent with patient: 30 minutes  Musculoskeletal: Strength & Muscle Tone: within normal limits Gait & Station: normal Patient leans: N/A  Psychiatric Specialty Exam: Review of Systems  Constitutional: Negative.   HENT: Negative.   Eyes: Negative.   Respiratory: Negative.   Cardiovascular: Negative.   Gastrointestinal: Negative.   Genitourinary: Negative.   Musculoskeletal: Negative.   Skin: Negative.   Neurological: Negative.   Endo/Heme/Allergies: Negative.   Psychiatric/Behavioral: Negative for depression, hallucinations, memory loss, substance abuse and suicidal ideas. The patient is not nervous/anxious and does not have insomnia.     Blood pressure 108/81, pulse 89, temperature 97.5 F (36.4 C), temperature source Oral, resp. rate 18, height 5' 4.25" (1.632 m), weight 74.4 kg (164 lb).Body mass index is 27.93 kg/m.  General Appearance: Neatly dressed, pleasant, engaging well and cooperative. Appropriate behavior. Not in any distress. Good relatedness. Not internally stimulated  Eye Contact::  Good  Speech: Spontaneous, normal prosody. Normal tone and rate.   Volume:  Normal  Mood:  Euthymic  Affect:  Appropriate and Full Range  Thought Process:  Goal Directed and Linear  Orientation:  Full (Time, Place, and Person)  Thought Content:  No delusional theme. No preoccupation with violent thoughts. No negative ruminations. No obsession.  No hallucination in any modality.   Suicidal Thoughts:  No  Homicidal Thoughts:  No  Memory:  Immediate;   Good Recent;   Good Remote;   Good  Judgement:  Good  Insight:  Good  Psychomotor Activity:  Normal  Concentration:  Good  Recall:  Good  Fund of Knowledge:Good  Language: Good  Akathisia:   No  Handed:    AIMS (if indicated):     Assets:  Communication Skills Desire for Improvement Housing Physical Health Resilience Social Support Transportation  Sleep:  Number of Hours: 6.75  Cognition: WNL  ADL's:  Intact   Clinical Assessment::   66 yo Caucasian male, divorced, lives alone. Background history of depression. Self presented to the ER  In own transport. Reported worsening depression. Was recently started on antidepressant on outpatient basis. Has not had benefit. Distressed by recent job loss and poor relationship with his daughter. Overdosed on his sleep medications at home. Did not need any medical attention. Sought voluntary treatment as he wanted to get better.  No past psychiatry history. No access to weapons. No past history of violent behavior. No past history of impulsivity. No past history of mania. No substance use disorder. Family history of unipolar depression. No family history of suicide.  Seen today. Reports benefit from treatment. His depression has lifted. Says he is now sleeping well. Appetite and energy has normalized. Able to think clearly. No longer having any suicidal thoughts. Tolerating his medications well. He is optimistic about the future. Looking forward to finding a new job. No evidence of mania. No evidence of psychosis. No anxiety.   Nursing staff reports that patient has been appropriate on the unit. Patient has been interacting well with peers. No behavioral issues. Patient has not voiced any suicidal thoughts. Patient has not been observed to be internally stimulated. Patient has been adherent with treatment recommendations. Patient has been tolerating their medication well.   Patient was discussed at team. Team members feels that patient is back to his baseline  level of function. Team agrees with plan to discharge patient today.  Demographic Factors:  Male, Age 53 or older, Caucasian and Unemployed  Loss Factors: Decrease in vocational  status  Historical Factors: NA  Risk Reduction Factors:   Sense of responsibility to family, Positive social support, Positive therapeutic relationship and Positive coping skills or problem solving skills  Continued Clinical Symptoms:  As above  Cognitive Features That Contribute To Risk:  None    Suicide Risk:  Minimal: No identifiable suicidal ideation.  Patient is not having any thoughts of suicide at this time. Modifiable risk factors targeted during this admission includes depression.  Demographical and historical risk factors cannot be modified. Patient is now engaging well. Patient is reliable and is future oriented. We have buffered patient's support structures. At this point, patient is at low risk of suicide. Patient is aware of the effects of psychoactive substances on decision making process. Patient has been provided with emergency contacts. Patient acknowledges to use resources provided if unforseen circumstances changes their current risk stratification.    Follow-up Information    [facility] Follow up.   Specialty:  Behavioral Health Why:  Please go for a walk-in appointment within 1-3 days of discharge. Walk-in hours are Mon-Fri 8AM-3PM. Please arrive as early as possible to be sure that you are seen. Contact information: [place] [address] [phone]        [facility] Follow up.   Why:  Social worker has left a message with the receptionist. If social worker is unable to get an appointment scheduled, please call the office on the day that you discharge and schedule an appointment. Thank you. Contact information: [address] [place], [place] [address] P: [phone] F: [phone]          Plan Of Care/Follow-up recommendations:  1. Continue current psychotropic medications 2. Mental health and addiction follow up as arranged.     Artist Beach, MD 01/15/2017, 9:11 AM

## 2017-01-15 NOTE — BHH Group Notes (Signed)
Adult Group Therapy Note  Date:  01/15/2017  Time: 10:00AM-11:00AM  Group Topic/Focus: Today's group focused on the topic of healthy and unhealthy coping skills.  Commonalities were found in group members, including drinking, using drugs, binge eating, isolation, anger outbursts, isolation and suicide attempts.  The reasons unhealthy coping skills are used were listed by group members, with a focus on them being fast, easy, and initially working.  The down sides were also discussed at length.  We ended the discussion by brainstorming ways to learn specific new healthy coping skills that patients said they would like to try.  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  He said that an unhealthy way of coping he has adopted recently is overeating and eating junk food when he is "shut down."  He also talked about how hard it is to accept help from his siblings when he has provided help in the past.  Group members and CSW shared with him other perspectives, such as that he now knows how hard it may be for his siblings at times to accept help because now he knows how it feels.  He talked about wanting a better relationship with his daughter, and how he could go about talking with her about his feeling and even this hospitalization.  Berlin Hun Grossman-Orr 01/15/2017 , 1:25 PM

## 2017-01-15 NOTE — Progress Notes (Signed)
D: Pt A & O X4. Denies SI, HI, AVH and pain when assessed. Approached in hall on initial contact, presents animated and anxious (pacing in hall) but pleasant "I'm ready to go today, the doctor told me he will release me today". Pt d/c home as per order. Ambulatory with a steady gait. Rates his depression, hopelessness and anxiety al 0/10 on self inventory sheet. A: Scheduled medications administered as prescribed with verbal education. Support and availability provided to pt. D/C instructions reviewed including medication samples, prescriptions and follow up appointments. All belongings from locker 10 returned to pt. Encouraged pt to comply with d/c instructions as discussed. Safety checks maintained at Q 15 minutes intervals till time of d/c without self harm gestures or outburst. R: Pt receptive to care. Verbalized understanding related to medications and d/c instructions. Signed belonging sheet in agreement with items received. Appears to be in no physical distress at time of departure.

## 2017-01-17 DIAGNOSIS — F332 Major depressive disorder, recurrent severe without psychotic features: Secondary | ICD-10-CM | POA: Diagnosis not present

## 2017-01-26 ENCOUNTER — Ambulatory Visit (INDEPENDENT_AMBULATORY_CARE_PROVIDER_SITE_OTHER): Payer: PPO | Admitting: Licensed Clinical Social Worker

## 2017-01-26 ENCOUNTER — Encounter (INDEPENDENT_AMBULATORY_CARE_PROVIDER_SITE_OTHER): Payer: Self-pay

## 2017-01-26 ENCOUNTER — Encounter (HOSPITAL_COMMUNITY): Payer: Self-pay | Admitting: Licensed Clinical Social Worker

## 2017-01-26 DIAGNOSIS — F332 Major depressive disorder, recurrent severe without psychotic features: Secondary | ICD-10-CM | POA: Diagnosis not present

## 2017-01-26 NOTE — Progress Notes (Signed)
Comprehensive Clinical Assessment (CCA) Note  01/26/2017 Caleb Berg [id number]  Visit Diagnosis:      ICD-9-CM ICD-10-CM   1. Severe episode of recurrent major depressive disorder, without psychotic features (Caleb Berg) 296.33 F33.2       CCA Part One  Part One has been completed on paper by the patient.  (See scanned document in Chart Review)  CCA Part Two A  Intake/Chief Complaint:  CCA Intake With Chief Complaint CCA Part Two Date: 01/26/17 CCA Part Two Time: 1022 Chief Complaint/Presenting Problem: Pt is being referred from [facility] inpatient stay. This is his first treatment. Depressive symptoms Patients Currently Reported Symptoms/Problems: Depressive symptoms Collateral Involvement: Notes from [facility] Individual's Strengths: desire to feel better Individual's Preferences: prefers to have a job and not be depressed Individual's Abilities: ability to feel better Type of Services Patient Feels Are Needed: unsure  Mental Health Symptoms Depression:  Depression: Change in energy/activity, Difficulty Concentrating, Fatigue, Sleep (too much or little), Hopelessness, Increase/decrease in appetite, Tearfulness, Worthlessness  Mania:     Anxiety:   Anxiety: Difficulty concentrating, Fatigue, Restlessness, Tension, Worrying  Psychosis:     Trauma:  Trauma: Detachment from others, Avoids reminders of event, Emotional numbing, Guilt/shame (divorce 2011)  Obsessions:     Compulsions:     Inattention:     Hyperactivity/Impulsivity:     Oppositional/Defiant Behaviors:     Borderline Personality:  Emotional Irregularity: Chronic feelings of emptiness, Unstable self-image  Other Mood/Personality Symptoms:      Mental Status Exam Appearance and self-care  Stature:  Stature: Average  Weight:  Weight: Average weight  Clothing:  Clothing: Casual  Grooming:  Grooming: Neglected  Cosmetic use:  Cosmetic Use: None  Posture/gait:  Posture/Gait: Normal  Motor activity:  Motor Activity: Not Remarkable   Sensorium  Attention:  Attention: Normal  Concentration:  Concentration: Anxiety interferes  Orientation:  Orientation: X5  Recall/memory:  Recall/Memory: Defective in short-term  Affect and Mood  Affect:  Affect: Depressed  Mood:  Mood: Anxious  Relating  Eye contact:  Eye Contact: Normal  Facial expression:  Facial Expression: Constricted  Attitude toward examiner:  Attitude Toward Examiner: Cooperative  Thought and Language  Speech flow: Speech Flow: Normal  Thought content:  Thought Content: Appropriate to mood and circumstances  Preoccupation:  Preoccupations: Ruminations  Hallucinations:     Organization:     Transport planner of Knowledge:  Fund of Knowledge: Impoverished by:  (Comment)  Intelligence:  Intelligence: Average  Abstraction:  Abstraction: Normal  Judgement:  Judgement: Fair  Art therapist:  Reality Testing: Adequate  Insight:  Insight: Fair  Decision Making:  Decision Making: Impulsive  Social Functioning  Social Maturity:  Social Maturity: Isolates  Social Judgement:  Social Judgement: Normal  Stress  Stressors:  Stressors: Family conflict, Grief/losses, Illness, Chiropodist, Work, Transitions  Coping Ability:  Coping Ability: Deficient supports, Theatre stage manager, English as a second language teacher Deficits:     Supports:      Family and Psychosocial History: Family history Marital status: Divorced Divorced, when?: 2011 What types of issues is patient dealing with in the relationship?: sadness after divorce, no contact with x-wife, 53yo daughter is upset because of the divorce Additional relationship information: married 58 years, married at age 72, mutual decision to divorce Does patient have children?: Yes How many children?: 1 How is patient's relationship with their children?: she only calls him on holidays and birthdays  Childhood History:  Childhood History By whom was/is the patient raised?: Both parents Additional childhood history information: good  childhood. mom and dad were hardworking Description of patient's relationship with caregiver when they were a child: good Patient's description of current relationship with people who raised him/her: both are deceased How were you disciplined when you got in trouble as a child/adolescent?: spanked 1 x in life. I never needed it. all kids were good kids Does patient have siblings?: Yes Number of Siblings: 5 Description of patient's current relationship with siblings: good Did patient suffer any verbal/emotional/physical/sexual abuse as a child?: No Did patient suffer from severe childhood neglect?: No Has patient ever been sexually abused/assaulted/raped as an adolescent or adult?: No Was the patient ever a victim of a crime or a disaster?: No Witnessed domestic violence?: No Has patient been effected by domestic violence as an adult?: No  CCA Part Two B  Employment/Work Situation: Employment / Work Copywriter, advertising Employment situation: Unemployed What is the longest time patient has a held a job?:  8 years 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 Has patient ever been in the [employer]?: No Has patient ever served in combat?: No Did You Receive Any Psychiatric Treatment/Services While in Passenger transport manager?: No Are There Guns or Other Weapons in [place]?: No Are These Psychologist, educational?: No  Education: Education Last Grade Completed: 14 Did Teacher, adult education From [employer]?: Yes Did Physicist, medical?: Yes What Type of College Degree Do you Have?: none Did You Attend Graduate School?: No Did You Have An Individualized Education Program (IIEP): No Did You Have Any Difficulty At [employer]?: No  Religion: Religion/Spirituality Are You A Religious Person?: No How Might This Affect Treatment?: wasn't raised in the church, parents worked all the time and sundays was day off  Leisure/Recreation: Leisure / Recreation Leisure and Hobbies: golf, reading, going to the  movies  Exercise/Diet: Exercise/Diet Do You Exercise?: No Have You Gained or Lost A Significant Amount of Weight in the Past Six Months?: No Do You Follow a Special Diet?: No Do You Have Any Trouble Sleeping?: Yes Explanation of Sleeping Difficulties: worrying too much  CCA Part Two C  Alcohol/Drug Use: Alcohol / Drug Use History of alcohol / drug use?: No history of alcohol / drug abuse                      CCA Part Three  ASAM's:  Six Dimensions of Multidimensional Assessment  Dimension 1:  Acute Intoxication and/or Withdrawal Potential:     Dimension 2:  Biomedical Conditions and Complications:     Dimension 3:  Emotional, Behavioral, or Cognitive Conditions and Complications:     Dimension 4:  Readiness to Change:     Dimension 5:  Relapse, Continued use, or Continued Problem Potential:     Dimension 6:  Recovery/Living Environment:      Substance use Disorder (SUD)    Social Function:  Social Functioning Social Maturity: Isolates Social Judgement: Normal  Stress:  Stress Stressors: Family conflict, Grief/losses, Illness, Chiropodist, Work, Transitions Coping Ability: Deficient supports, Theatre stage manager, Software engineer Patient Takes Medications The Way The Doctor Instructed?: Yes Priority Risk: Low Acuity  Risk Assessment- Self-Harm Potential: Risk Assessment For Self-Harm Potential Thoughts of Self-Harm: No current thoughts Method: No plan Availability of Means: No access/NA  Risk Assessment -Dangerous to Others Potential: Risk Assessment For Dangerous to Others Potential Method: No Plan Availability of Means: No access or NA Intent: Vague intent or NA  DSM5 Diagnoses: Patient Active Problem List   Diagnosis Date Noted  . MDD (major depressive  disorder), recurrent episode, severe ([place]) 01/12/2017    Patient Centered Plan: Patient is on the following Treatment Plan(s): depression, family issues  Recommendations for  Services/Supports/Treatments: Recommendations for Services/Supports/Treatments Recommendations For Services/Supports/Treatments: Individual Therapy, Medication Management  Treatment Plan Summary:    Referrals to Alternative Service(s): Referred to Alternative Service(s):   Place:   Date:   Time:    Referred to Alternative Service(s):   Place:   Date:   Time:    Referred to Alternative Service(s):   Place:   Date:   Time:    Referred to Alternative Service(s):   Place:   Date:   Time:     Jenkins Rouge

## 2017-02-09 ENCOUNTER — Ambulatory Visit (INDEPENDENT_AMBULATORY_CARE_PROVIDER_SITE_OTHER): Payer: PPO | Admitting: Clinical

## 2017-02-09 ENCOUNTER — Encounter (HOSPITAL_COMMUNITY): Payer: Self-pay | Admitting: Clinical

## 2017-02-09 ENCOUNTER — Ambulatory Visit (HOSPITAL_COMMUNITY): Payer: Self-pay | Admitting: Licensed Clinical Social Worker

## 2017-02-09 DIAGNOSIS — F332 Major depressive disorder, recurrent severe without psychotic features: Secondary | ICD-10-CM | POA: Diagnosis not present

## 2017-02-09 NOTE — Progress Notes (Signed)
Blank

## 2017-02-11 NOTE — Progress Notes (Signed)
   THERAPIST PROGRESS NOTE  Session Time: 2:30 -3:25  Participation Level: Active  Behavioral Response: CasualAlertDepressed  Type of Therapy: Individual Therapy  Treatment Goals addressed: improve psychiatric symptoms, learn about diagnosis, healthy coping skills  Interventions:motivational interviewing, grounding and mindfulness techniques  Summary: Caleb Berg is a 65 y.o. male who presents with Severe episode of recurrent major depressive disorder, without psychotic features   Suicidal/Homicidal: Nowithout intent/plan  Therapist Response: [place] met with clinician for an individual therapy session. He discussed his psychiatric symptoms, his current life events and his goals for therapy. Clinician asked open ended questions and [name] shared that he had been hospitalized recently do to depressive thoughts. He denied any current suicidal or homicidal ideation. Client and clinician discussed his diagnosis.Client and clinician discussed his psychiatric symptoms.  Clinician introduced grounding and mindfulness techniques. Clinician explained the process and purpose and practice of the techniques. Client and clinician practiced the techniques together.   Plan: Return again in 1-2 weeks.  Diagnosis: Axis I: Severe episode of recurrent major depressive disorder, without psychotic features        , A, LCSW 02/11/2017  

## 2017-03-03 ENCOUNTER — Ambulatory Visit (HOSPITAL_COMMUNITY): Payer: Self-pay | Admitting: Psychiatry

## 2017-03-09 ENCOUNTER — Ambulatory Visit (HOSPITAL_COMMUNITY): Payer: Self-pay | Admitting: Clinical

## 2017-03-10 ENCOUNTER — Ambulatory Visit (HOSPITAL_COMMUNITY): Payer: Self-pay | Admitting: Clinical

## 2017-03-16 ENCOUNTER — Ambulatory Visit (HOSPITAL_COMMUNITY): Payer: Self-pay | Admitting: Psychiatry

## 2017-04-07 ENCOUNTER — Encounter (HOSPITAL_COMMUNITY): Payer: Self-pay | Admitting: *Deleted

## 2017-04-07 ENCOUNTER — Emergency Department (HOSPITAL_COMMUNITY): Admission: EM | Admit: 2017-04-07 | Discharge: 2017-04-07 | Discharge status: Against Medical Advice | Payer: PPO

## 2017-04-07 ENCOUNTER — Emergency Department (HOSPITAL_COMMUNITY)
Admission: EM | Admit: 2017-04-07 | Discharge: 2017-04-08 | Disposition: A | Discharge status: Other Acute Inpatient Hospital | Payer: Medicare HMO | Attending: Emergency Medicine | Admitting: Emergency Medicine

## 2017-04-07 DIAGNOSIS — F332 Major depressive disorder, recurrent severe without psychotic features: Secondary | ICD-10-CM

## 2017-04-07 DIAGNOSIS — Z0000 Encounter for general adult medical examination without abnormal findings: Secondary | ICD-10-CM

## 2017-04-07 DIAGNOSIS — Z79899 Other long term (current) drug therapy: Secondary | ICD-10-CM | POA: Diagnosis not present

## 2017-04-07 DIAGNOSIS — Z046 Encounter for general psychiatric examination, requested by authority: Secondary | ICD-10-CM | POA: Diagnosis not present

## 2017-04-07 DIAGNOSIS — F329 Major depressive disorder, single episode, unspecified: Secondary | ICD-10-CM | POA: Diagnosis present

## 2017-04-07 DIAGNOSIS — F341 Dysthymic disorder: Secondary | ICD-10-CM | POA: Insufficient documentation

## 2017-04-07 HISTORY — DX: Major depressive disorder, single episode, unspecified: F32.9

## 2017-04-07 HISTORY — DX: Depression, unspecified: F32.A

## 2017-04-07 LAB — CBC WITH DIFFERENTIAL/PLATELET
Basophils Absolute: 0 10*3/uL (ref 0.0–0.1)
Basophils Relative: 0 %
Eosinophils Absolute: 0.1 10*3/uL (ref 0.0–0.7)
Eosinophils Relative: 1 %
HEMATOCRIT: 40 % (ref 39.0–52.0)
HEMOGLOBIN: 13.6 g/dL (ref 13.0–17.0)
LYMPHS PCT: 20 %
Lymphs Abs: 2 10*3/uL (ref 0.7–4.0)
MCH: 28 pg (ref 26.0–34.0)
MCHC: 34 g/dL (ref 30.0–36.0)
MCV: 82.5 fL (ref 78.0–100.0)
MONO ABS: 1.5 10*3/uL — AB (ref 0.1–1.0)
MONOS PCT: 14 %
NEUTROS ABS: 6.8 10*3/uL (ref 1.7–7.7)
NEUTROS PCT: 65 %
Platelets: 177 10*3/uL (ref 150–400)
RBC: 4.85 MIL/uL (ref 4.22–5.81)
RDW: 13.6 % (ref 11.5–15.5)
WBC: 10.4 10*3/uL (ref 4.0–10.5)

## 2017-04-07 LAB — COMPREHENSIVE METABOLIC PANEL
ALBUMIN: 4.2 g/dL (ref 3.5–5.0)
ALT: 23 U/L (ref 17–63)
ANION GAP: 10 (ref 5–15)
AST: 32 U/L (ref 15–41)
Alkaline Phosphatase: 69 U/L (ref 38–126)
BUN: 33 mg/dL — AB (ref 6–20)
CHLORIDE: 101 mmol/L (ref 101–111)
CO2: 28 mmol/L (ref 22–32)
Calcium: 9.3 mg/dL (ref 8.9–10.3)
Creatinine, Ser: 1.4 mg/dL — ABNORMAL HIGH (ref 0.61–1.24)
GFR calc Af Amer: 59 mL/min — ABNORMAL LOW (ref >60)
GFR calc non Af Amer: 51 mL/min — ABNORMAL LOW (ref >60)
GLUCOSE: 160 mg/dL — AB (ref 65–99)
POTASSIUM: 3.7 mmol/L (ref 3.5–5.1)
SODIUM: 139 mmol/L (ref 135–145)
Total Bilirubin: 0.4 mg/dL (ref 0.3–1.2)
Total Protein: 7.3 g/dL (ref 6.5–8.1)

## 2017-04-07 LAB — SALICYLATE LEVEL

## 2017-04-07 LAB — ETHANOL: Alcohol, Ethyl (B): <5 mg/dL (ref <5)

## 2017-04-07 LAB — ACETAMINOPHEN LEVEL: Acetaminophen (Tylenol), Serum: <10 ug/mL — ABNORMAL LOW (ref 10–30)

## 2017-04-07 NOTE — ED Notes (Signed)
Bed: GV02 Expected date:  Expected time:  Means of arrival:  Comments: [employer]

## 2017-04-07 NOTE — ED Notes (Signed)
Called patient for triage, but no answer.

## 2017-04-07 NOTE — ED Triage Notes (Signed)
Pt stated "I ran out of my meds about a week or so ago.  I can't afford them.  I went to [facility] and they told me 6 wks for an appt.  I can't wait that long with this depression."

## 2017-04-07 NOTE — ED Notes (Signed)
Called a second time for triage. No answer.

## 2017-04-07 NOTE — ED Notes (Signed)
Nurse wants PA to see patient before the labs are drawn

## 2017-04-07 NOTE — ED Notes (Signed)
Bed: WA31 Expected date:  Expected time:  Means of arrival:  Comments: 

## 2017-04-07 NOTE — ED Notes (Signed)
Bed: UF41 Expected date: 04/07/17 Expected time:  Means of arrival:  Comments:
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
## 2017-04-08 ENCOUNTER — Inpatient Hospital Stay
Admission: EM | Admit: 2017-04-08 | Discharge: 2017-04-15 | DRG: 885 | Disposition: A | Discharge status: Home / Self Care | Payer: Medicare HMO | Source: Intra-hospital | Attending: Psychiatry | Admitting: Psychiatry

## 2017-04-08 ENCOUNTER — Emergency Department (HOSPITAL_COMMUNITY): Payer: Medicare HMO

## 2017-04-08 DIAGNOSIS — G4710 Hypersomnia, unspecified: Secondary | ICD-10-CM | POA: Diagnosis present

## 2017-04-08 DIAGNOSIS — G4700 Insomnia, unspecified: Secondary | ICD-10-CM | POA: Diagnosis present

## 2017-04-08 DIAGNOSIS — Z79899 Other long term (current) drug therapy: Secondary | ICD-10-CM

## 2017-04-08 DIAGNOSIS — F319 Bipolar disorder, unspecified: Secondary | ICD-10-CM

## 2017-04-08 DIAGNOSIS — R45851 Suicidal ideations: Secondary | ICD-10-CM | POA: Diagnosis present

## 2017-04-08 DIAGNOSIS — F1010 Alcohol abuse, uncomplicated: Secondary | ICD-10-CM | POA: Diagnosis present

## 2017-04-08 DIAGNOSIS — F419 Anxiety disorder, unspecified: Secondary | ICD-10-CM | POA: Diagnosis present

## 2017-04-08 DIAGNOSIS — G3184 Mild cognitive impairment, so stated: Secondary | ICD-10-CM

## 2017-04-08 DIAGNOSIS — Z818 Family history of other mental and behavioral disorders: Secondary | ICD-10-CM

## 2017-04-08 DIAGNOSIS — F332 Major depressive disorder, recurrent severe without psychotic features: Secondary | ICD-10-CM | POA: Diagnosis not present

## 2017-04-08 DIAGNOSIS — R632 Polyphagia: Secondary | ICD-10-CM | POA: Diagnosis present

## 2017-04-08 DIAGNOSIS — R7303 Prediabetes: Secondary | ICD-10-CM

## 2017-04-08 DIAGNOSIS — F4310 Post-traumatic stress disorder, unspecified: Secondary | ICD-10-CM | POA: Diagnosis present

## 2017-04-08 LAB — URINALYSIS, ROUTINE W REFLEX MICROSCOPIC
BACTERIA UA: NONE SEEN
BILIRUBIN URINE: NEGATIVE
GLUCOSE, UA: NEGATIVE mg/dL
HGB URINE DIPSTICK: NEGATIVE
KETONES UR: NEGATIVE mg/dL
LEUKOCYTES UA: NEGATIVE
NITRITE: NEGATIVE
PROTEIN: 30 mg/dL — AB
SQUAMOUS EPITHELIAL / LPF: NONE SEEN
Specific Gravity, Urine: 1.027 (ref 1.005–1.030)
pH: 5 (ref 5.0–8.0)

## 2017-04-08 LAB — RAPID URINE DRUG SCREEN, HOSP PERFORMED
Amphetamines: NOT DETECTED
Barbiturates: NOT DETECTED
Benzodiazepines: NOT DETECTED
COCAINE: NOT DETECTED
OPIATES: NOT DETECTED
TETRAHYDROCANNABINOL: NOT DETECTED

## 2017-04-08 MED ORDER — DULOXETINE HCL 30 MG PO CPEP
60.0000 mg | ORAL_CAPSULE | Freq: Every day | ORAL | Status: DC
  Administered 2017-04-08: 60 mg via ORAL
  Filled 2017-04-08: qty 2

## 2017-04-08 MED ORDER — DULOXETINE HCL 30 MG PO CPEP
60.0000 mg | ORAL_CAPSULE | Freq: Every day | ORAL | Status: DC
  Administered 2017-04-09 – 2017-04-15 (×7): 60 mg via ORAL
  Filled 2017-04-08 (×7): qty 2

## 2017-04-08 MED ORDER — DULOXETINE HCL 20 MG PO CPEP: 20.0000 mg | ORAL_CAPSULE | Freq: Two times a day (BID) | ORAL | Status: DC

## 2017-04-08 MED ORDER — TRAZODONE HCL 100 MG PO TABS: 100.0000 mg | ORAL_TABLET | Freq: Every evening | ORAL | Status: DC | PRN

## 2017-04-08 MED ORDER — ALUM & MAG HYDROXIDE-SIMETH 200-200-20 MG/5ML PO SUSP: 30.0000 mL | ORAL | Status: DC | PRN

## 2017-04-08 MED ORDER — ACETAMINOPHEN 325 MG PO TABS: 650.0000 mg | ORAL_TABLET | Freq: Four times a day (QID) | ORAL | Status: DC | PRN

## 2017-04-08 MED ORDER — MAGNESIUM HYDROXIDE 400 MG/5ML PO SUSP: 30.0000 mL | Freq: Every day | ORAL | Status: DC | PRN

## 2017-04-08 NOTE — ED Notes (Signed)
Patient is to be admitted to [facility] by Dr. Dr Bary Leriche .  Attending Physician will be Dr. Jerilee Hoh.   Patient has been assigned to room 310, by [place].   Intake Paper Work has been signed and placed on patient chart.  [facility] staff & Vonna Kotyk Patient Access are aware of the admission

## 2017-04-08 NOTE — ED Provider Notes (Signed)
[facility] DEPT Provider Note   CSN: [id number] Arrival date & time: 04/07/17  1934     History   Chief Complaint Chief Complaint  Patient presents with  . Depression    HPI Caleb Berg is a 67 y.o. male.  Patient with history of major depressive disorder presents with symptoms of depression. No HI/AVH. He states that if he went to sleep and didn't wake up he wouldn't care, but denies SI. He has had SI in the past. He states he has gone to [facility] and [facility] for treatment in the past.    The history is provided by the patient. No language interpreter was used.  Depression     Past Medical History:  Diagnosis Date  . Depression     Patient Active Problem List   Diagnosis Date Noted  . MDD (major depressive disorder), recurrent episode, severe ([place]) 01/12/2017    Past Surgical History:  Procedure Laterality Date  . HERNIA REPAIR         Home Medications    Prior to Admission medications   Medication Sig Start Date End Date Taking? Authorizing Provider  DULoxetine (CYMBALTA) 20 MG capsule Take 1 capsule (20 mg total) by mouth 2 (two) times daily. For depression Patient not taking: Reported on 04/07/2017 01/15/17   Lindell Spar I, NP  hydrOXYzine (ATARAX/VISTARIL) 25 MG tablet Take 1 tablet (25 mg total) by mouth every 6 (six) hours as needed for anxiety. Patient not taking: Reported on 04/07/2017 01/15/17   Lindell Spar I, NP  traZODone (DESYREL) 100 MG tablet Take 1 tablet (100 mg total) by mouth at bedtime as needed for sleep. Patient not taking: Reported on 04/07/2017 01/15/17   Lindell Spar I, NP    Family History No family history on file.  Social History Social History  Substance Use Topics  . Smoking status: Never Smoker  . Smokeless tobacco: Never Used  . Alcohol use No     Allergies   Patient has no known allergies.   Review of Systems Review of Systems  Constitutional: Negative for chills and fever.  HENT: Negative.   Respiratory:  Negative.   Cardiovascular: Negative.   Gastrointestinal: Negative.   Musculoskeletal: Negative.   Skin: Negative.   Neurological: Negative.   Psychiatric/Behavioral: Positive for depression and dysphoric mood. Negative for suicidal ideas.     Physical Exam Updated Vital Signs BP 127/83 (BP Location: Left Arm)   Pulse 84   Temp 100 F (37.8 C) (Oral)   Resp 18   Ht 5\' 6"  (1.676 m)   Wt 77.6 kg (171 lb)   SpO2 95%   BMI 27.60 kg/m   Physical Exam  Constitutional: He is oriented to person, place, and time. He appears well-developed and well-nourished.  HENT:  Head: Normocephalic.  Neck: Normal range of motion. Neck supple.  Cardiovascular: Normal rate and regular rhythm.   Pulmonary/Chest: Effort normal and breath sounds normal.  Abdominal: Soft. Bowel sounds are normal. There is no tenderness. There is no rebound and no guarding.  Musculoskeletal: Normal range of motion.  Neurological: He is alert and oriented to person, place, and time.  Skin: Skin is warm and dry. No rash noted.  Psychiatric: He has a normal mood and affect.     ED Treatments / Results  Labs (all labs ordered are listed, but only abnormal results are displayed) Labs Reviewed  COMPREHENSIVE METABOLIC PANEL - Abnormal; Notable for the following:       Result Value  Glucose, Bld 160 (*)    BUN 33 (*)    Creatinine, Ser 1.40 (*)    GFR calc non Af Amer 51 (*)    GFR calc Af Amer 59 (*)    All other components within normal limits  CBC WITH DIFFERENTIAL/PLATELET - Abnormal; Notable for the following:    Monocytes Absolute 1.5 (*)    All other components within normal limits  ACETAMINOPHEN LEVEL - Abnormal; Notable for the following:    Acetaminophen (Tylenol), Serum <10 (*)    All other components within normal limits  ETHANOL  SALICYLATE LEVEL  RAPID URINE DRUG SCREEN, HOSP PERFORMED   Results for orders placed or performed during the hospital encounter of 04/07/17  Comprehensive metabolic  panel  Result Value Ref Range   Sodium 139 135 - 145 mmol/L   Potassium 3.7 3.5 - 5.1 mmol/L   Chloride 101 101 - 111 mmol/L   CO2 28 22 - 32 mmol/L   Glucose, Bld 160 (H) 65 - 99 mg/dL   BUN 33 (H) 6 - 20 mg/dL   Creatinine, Ser 1.40 (H) 0.61 - 1.24 mg/dL   Calcium 9.3 8.9 - 10.3 mg/dL   Total Protein 7.3 6.5 - 8.1 g/dL   Albumin 4.2 3.5 - 5.0 g/dL   AST 32 15 - 41 U/L   ALT 23 17 - 63 U/L   Alkaline Phosphatase 69 38 - 126 U/L   Total Bilirubin 0.4 0.3 - 1.2 mg/dL   GFR calc non Af Amer 51 (L) >60 mL/min   GFR calc Af Amer 59 (L) >60 mL/min   Anion gap 10 5 - 15  Ethanol  Result Value Ref Range   Alcohol, Ethyl (B) <5 <5 mg/dL  CBC with Diff  Result Value Ref Range   WBC 10.4 4.0 - 10.5 K/uL   RBC 4.85 4.22 - 5.81 MIL/uL   Hemoglobin 13.6 13.0 - 17.0 g/dL   HCT 40.0 39.0 - 52.0 %   MCV 82.5 78.0 - 100.0 fL   MCH 28.0 26.0 - 34.0 pg   MCHC 34.0 30.0 - 36.0 g/dL   RDW 13.6 11.5 - 15.5 %   Platelets 177 150 - 400 K/uL   Neutrophils Relative % 65 %   Neutro Abs 6.8 1.7 - 7.7 K/uL   Lymphocytes Relative 20 %   Lymphs Abs 2.0 0.7 - 4.0 K/uL   Monocytes Relative 14 %   Monocytes Absolute 1.5 (H) 0.1 - 1.0 K/uL   Eosinophils Relative 1 %   Eosinophils Absolute 0.1 0.0 - 0.7 K/uL   Basophils Relative 0 %   Basophils Absolute 0.0 0.0 - 0.1 K/uL  Acetaminophen level  Result Value Ref Range   Acetaminophen (Tylenol), Serum <10 (L) 10 - 30 ug/mL  Salicylate level  Result Value Ref Range   Salicylate Lvl <1.0 2.8 - 30.0 mg/dL    EKG  EKG Interpretation None       Radiology No results found.  Procedures Procedures (including critical care time)  Medications Ordered in ED Medications - No data to display   Initial Impression / Assessment and Plan / ED Course  I have reviewed the triage vital signs and the nursing notes.  Pertinent labs & imaging results that were available during my care of the patient were reviewed by me and considered in my medical decision  making (see chart for details).     Patient with a history of severe depression, SI in the past, none now. He has  tried to contact services in the outpatient setting for help but reports nothing is available now. It is concerning that if the patient does not receive help he will continue to decline. TTS consultation requested to assist.  TTS recommends am psych evaluation.    Final Clinical Impressions(s) / ED Diagnoses   Final diagnoses:  None   1. Depression   New Prescriptions New Prescriptions   No medications on file     Dennie Bible 04/15/17 2225    Shanon Rosser, MD 04/18/17 2240

## 2017-04-08 NOTE — ED Notes (Signed)
Patient transported to X-ray 
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
## 2017-04-08 NOTE — BH Assessment (Addendum)
Tele Assessment Note   Caleb Berg is an 66 y.o. male, who presents voluntary and unaccompanied to [facility]. Pt reported, being very depressed and out of his medications, for a couple of weeks. Pt could not recall the medications he was prescribed, but noted they were for anxiety and sleep. Pt reported, his depression has increased due to being unemployed and possibly homeless. Pt reported, "I don't want to be alive."  Pt reported, "I go to bed hoping not to wake up in the morning." Pt reported, "I wouldn't hurt myself." Pt reported, overdosing three times in the past and his most recent suicide attempt was in March/April 2018, for overdosing on sleeping pills. Pt denied HI, AVH self-injurious behaviors and access to weapons.   Pt denied abuse. Pt denied substance use. Pt's UDS is pending. Pt denied being linked to OPT resources (medication management and/or counseling.) Per chart, pt was linked to [facility], for counseling. Per chart, pt was seen April 11th and 15th. Pt reported, his medication was prescribed by [place] reported, he tried going to [facility] to see a psychiatrist however he would have to wait six weeks for an available appointment. Pt has previous inpatient admissions, for depression.   Pt presents quiet/awake in scrubs with logical/cohernet speech.  Pt's eye contact was poor. Pt's mood was sad, helpless. Pt's affect was appropriate to circumstances. Pt's judgement was partial. Pt's concentration was normal. Pt's insight and impulse control are fair. Pt was oriented x3 (year, city and state.) Pt reported, if discharged from [facility] he could contract for safety. Pt reported, if inpatient treatment was recommended he would sign-in voluntarily.   Diagnosis: Major Depressive Disorder, Recurrent, Severe without Psychotic Features  Past Medical History:  Past Medical History:  Diagnosis Date  . Depression     Past Surgical History:  Procedure Laterality Date  . HERNIA  REPAIR      Family History: No family history on file.  Social History:  reports that he has never smoked. He has never used smokeless tobacco. He reports that he does not drink alcohol or use drugs.  Additional Social History:  Alcohol / Drug Use Pain Medications: See MAR Prescriptions: See MAR Over the Counter: See MAR History of alcohol / drug use?:  (Pt denies, UDS is pending.)  CIWA: CIWA-Ar BP: 127/83 Pulse Rate: 84 COWS:    PATIENT STRENGTHS: (choose at least two) Average or above average intelligence Supportive family/friends  Allergies: No Known Allergies  Home Medications:  (Not in a hospital admission)  OB/GYN Status:  No LMP for male patient.  General Assessment Data Location of Assessment: [facility] TTS Assessment: In system Is this a Tele or Face-to-Face Assessment?: Face-to-Face Is this an Initial Assessment or a Re-assessment for this encounter?: Initial Assessment Marital status: Divorced Living Arrangements: Alone Can pt return to current living arrangement?: Yes Admission Status: Voluntary Is patient capable of signing voluntary admission?: Yes Referral Source: Self/Family/Friend Insurance type: Counselling psychologist     Crisis Care Plan Living Arrangements: Alone Legal Guardian: Other: (Self) Name of Psychiatrist: NA Name of Therapist: NA  Education Status Is patient currently in school?: No Current Grade: NA Highest grade of school patient has completed: Pt reports, a couple years of college. Name of school: NA Contact person: NA  Risk to self with the past 6 months Suicidal Ideation: Yes-Currently Present (Passive) Has patient been a risk to self within the past 6 months prior to admission? : Yes Suicidal Intent: No-Not Currently/Within Last  6 Months Has patient had any suicidal intent within the past 6 months prior to admission? : Yes Is patient at risk for suicide?: No Suicidal Plan?: No-Not Currently/Within Last 6 Months Has  patient had any suicidal plan within the past 6 months prior to admission? : Yes Access to Means: No What has been your use of drugs/alcohol within the last 12 months?: Pt's UDS is pending.  Previous Attempts/Gestures: Yes How many times?: 3 Other Self Harm Risks: Pt denies. Triggers for Past Attempts: Unpredictable Intentional Self Injurious Behavior: None (Pt denies. ) Family Suicide History: No Recent stressful life event(s): Other (Comment) (unemployment and possible homelessness. ) Persecutory voices/beliefs?: No Depression: Yes Depression Symptoms: Feeling angry/irritable, Feeling worthless/self pity, Loss of interest in usual pleasures, Fatigue, Isolating, Guilt Substance abuse history and/or treatment for substance abuse?: No Suicide prevention information given to non-admitted patients: Not applicable  Risk to Others within the past 6 months Homicidal Ideation: No (Pt denies.) Does patient have any lifetime risk of violence toward others beyond the six months prior to admission? : No Thoughts of Harm to Others: No Current Homicidal Intent: No Current Homicidal Plan: No Access to Homicidal Means: No Identified Victim: NA History of harm to others?: No Assessment of Violence: None Noted Violent Behavior Description: Pt denies.  Does patient have access to weapons?: No (Pt denies.) Criminal Charges Pending?: No Does patient have a court date: No Is patient on probation?: No  Psychosis Hallucinations: None noted Delusions: None noted  Mental Status Report Appearance/Hygiene: In scrubs Eye Contact: Poor Motor Activity: Unremarkable Speech: Logical/coherent Level of Consciousness: Quiet/awake Mood: Sad, Helpless Affect: Appropriate to circumstance Anxiety Level: None Thought Processes: Coherent, Relevant Judgement: Partial Orientation: Other (Comment) (year, city and state.) Obsessive Compulsive Thoughts/Behaviors: None  Cognitive Functioning Concentration:  Normal Memory: Recent Intact IQ: Average Insight: Fair Impulse Control: Fair Appetite: Good Weight Loss: 0 Weight Gain: 0 Sleep:  (Pt reported, it depends.) Total Hours of Sleep:  (Pt reported, it depends.) Vegetative Symptoms: None  ADLScreening [facility] Assessment Services) Patient's cognitive ability adequate to safely complete daily activities?: Yes Patient able to express need for assistance with ADLs?: Yes Independently performs ADLs?: Yes (appropriate for developmental age)  Prior Inpatient Therapy Prior Inpatient Therapy: Yes Prior Therapy Dates: January 12, 2017 Prior Therapy Facilty/Provider(s): [facility] Reason for Treatment: overdose  Prior Outpatient Therapy Prior Outpatient Therapy: Yes Prior Therapy Dates: Per chart, 01/26/2017 and 01/30/2017. Prior Therapy Facilty/Provider(s): Per chart: [facility] Reason for Treatment: Counseling Does patient have an ACCT team?: No Does patient have Intensive In-House Services?  : No Does patient have [facility] services? : No (Pt reported, trying to go to [facility] for med. management.l) Does patient have P4CC services?: No  ADL Screening (condition at time of admission) Patient's cognitive ability adequate to safely complete daily activities?: Yes Is the patient deaf or have difficulty hearing?: No Does the patient have difficulty seeing, even when wearing glasses/contacts?: Yes Does the patient have difficulty concentrating, remembering, or making decisions?: No Patient able to express need for assistance with ADLs?: Yes Does the patient have difficulty dressing or bathing?: No Independently performs ADLs?: Yes (appropriate for developmental age) Does the patient have difficulty walking or climbing stairs?: No Weakness of Legs: None Weakness of Arms/Hands: None       Abuse/Neglect Assessment (Assessment to be complete while patient is alone) Physical Abuse: Denies (Pt denies. ) Verbal Abuse: Denies (Pt denies.  ) Sexual Abuse: Denies (Pt denies. ) Exploitation of patient/patient's resources: Denies (  Pt denies. ) Self-Neglect: Denies (Pt denies. )     Advance Directives (For Healthcare) Does Patient Have a Medical Advance Directive?: No    Additional Information 1:1 In Past 12 Months?: No CIRT Risk: No Elopement Risk: No Does patient have medical clearance?: Yes     Disposition: Lindon Romp, NP recommends AM Psychiatric Evaluation. Disposition discussed with Nehemiah Settle, PA who is in agreement, also discussed with Margaretha Sheffield, Therapist, sports.   Disposition Initial Assessment Completed for this Encounter: Yes Disposition of Patient: Other dispositions (AM Psych Eval.) Other disposition(s): Other (Comment) (AM Psychiatric Eval. )  Vertell Novak 04/08/2017 1:55 AM   Vertell Novak, MS, [facility], [place] Triage Specialist [phone]

## 2017-04-08 NOTE — BH Assessment (Signed)
[place] Assessment Progress Note  Per Corena Pilgrim, MD, this pt requires psychiatric hospitalization.  Leonia Reader RN, AC reports that pt has been accepted to [facility] by Dr Bary Leriche to Rm 315.  Pt has signed Voluntary Admission and Consent for Treatment, as well as Consent to Release Information to no one, and signed forms have been faxed to [phone].  Pt's nurse has been notified, and agrees to call report to [phone].  Pt is to be transported via [place], [place] Triage [place]

## 2017-04-08 NOTE — ED Notes (Signed)
Report attempted for [place] [facility].

## 2017-04-08 NOTE — ED Notes (Signed)
ED Provider at bedside. 

## 2017-04-08 NOTE — Tx Team (Signed)
Initial Treatment Plan 04/08/2017 3:22 PM Caleb Berg PUL:[id number]    PATIENT STRESSORS: Financial difficulties Occupational concerns   PATIENT STRENGTHS: Active sense of humor Average or above average intelligence Motivation for treatment/growth   PATIENT IDENTIFIED PROBLEMS: Depression Loss of job Evicted from home Car repossessed                     DISCHARGE CRITERIA:  Ability to meet basic life and health needs Adequate post-discharge living arrangements  PRELIMINARY DISCHARGE PLAN: Outpatient therapy Placement in alternative living arrangements  PATIENT/FAMILY INVOLVEMENT: This treatment plan has been presented to and reviewed with the patient, Caleb Berg.  The patient and family have been given the opportunity to ask questions and make suggestions.  Jenna Luo, RN 04/08/2017, 3:22 PM

## 2017-04-08 NOTE — ED Notes (Signed)
[facility] has been called. ETA uknown. Will call back with they are on their way.  Pt informed and agrees for transport

## 2017-04-08 NOTE — ED Notes (Signed)
[place] at bedside to discuss plan.

## 2017-04-08 NOTE — ED Notes (Signed)
TTS assessment in progress. 

## 2017-04-08 NOTE — ED Notes (Signed)
Psych rounding team at bedside.

## 2017-04-08 NOTE — Progress Notes (Signed)
Pt transferred to [facility]U from [facility].  Pt calm, cooperative and pleasant.  Pt reports he is here for depression related to life circumstances, "I lost my job.  My car got repossessed.  I couldn't pay the rent so I just had to put all my stuff in a knapsack and move out."  Pt reports he ran out of meds and couldn't afford more, started feeling increasingly depressed and went to ER.  Reports he worked at [employer] - does not elaborate on circumstances of losing job.  Rates depression and anxiety 8/10.  Reports one suicide attempt in March 2018, attempted to overdose on pills, which resulted in stay at [facility].  Pt denies any other attempts and any recent SI or intent.  Denies HI/AVH.  Pt also reports depression is related to his divorce and poor relationship with 67 year old daughter who lives in [place].  "But she did call me on Father's Day which I was surprised about."   States goals are to "get back on my meds, get back on my feet and get on with me life."

## 2017-04-09 DIAGNOSIS — F332 Major depressive disorder, recurrent severe without psychotic features: Secondary | ICD-10-CM

## 2017-04-09 LAB — VITAMIN B12: Vitamin B-12: 403 pg/mL (ref 180–914)

## 2017-04-09 MED ORDER — TRAZODONE HCL 100 MG PO TABS
100.0000 mg | ORAL_TABLET | Freq: Every day | ORAL | Status: DC
  Administered 2017-04-09 – 2017-04-14 (×6): 100 mg via ORAL
  Filled 2017-04-09 (×6): qty 1

## 2017-04-09 NOTE — Plan of Care (Signed)
Problem: Safety: Goal: Ability to remain free from injury will improve Outcome: Progressing Patient remains safe and free from injury during hospitalization and on Q 15 minute checks. Will continue to monitor.

## 2017-04-09 NOTE — BHH Suicide Risk Assessment (Signed)
[place] INPATIENT:  Family/Significant Other Suicide Prevention Education  Suicide Prevention Education:  Education Completed;Ralph Herskowitz(brother [phone]), has been identified by the patient as the family member/significant other with whom the patient will be residing, and identified as the person(s) who will aid the patient in the event of a mental health crisis (suicidal ideations/suicide attempt).  With written consent from the patient, the family member/significant other has been provided the following suicide prevention education, prior to the and/or following the discharge of the patient.  The suicide prevention education provided includes the following:  Suicide risk factors  Suicide prevention and interventions  National Suicide Hotline telephone number  [facility] assessment telephone number  [facility] Emergency Assistance [place] and/or Residential Mobile Crisis Unit telephone number  Request made of family/significant other to:  Remove weapons (e.g., guns, rifles, knives), all items previously/currently identified as safety concern.    Remove drugs/medications (over-the-counter, prescriptions, illicit drugs), all items previously/currently identified as a safety concern.  The family member/significant other verbalizes understanding of the suicide prevention education information provided.  The family member/significant other agrees to remove the items of safety concern listed above.  Malayna Noori [facility]. [place], [place] 04/09/2017, 4:30 PM

## 2017-04-09 NOTE — BHH Suicide Risk Assessment (Signed)
[facility] Admission Suicide Risk Assessment   Nursing information obtained from:    Demographic factors:    Current Mental Status:    Loss Factors:    Historical Factors:    Risk Reduction Factors:     Total Time spent with patient: 1 hour Principal Problem: MDD (major depressive disorder), recurrent severe, without psychosis ([place]) Diagnosis:   Patient Active Problem List   Diagnosis Date Noted  . MDD (major depressive disorder), recurrent severe, without psychosis ([place]) [F33.2] 04/08/2017   Subjective Data:   Continued Clinical Symptoms:  Alcohol Use Disorder Identification Test Final Score (AUDIT): 0 The "Alcohol Use Disorders Identification Test", Guidelines for Use in Primary Care, Second Edition.  World Pharmacologist [facility]). Score between 0-7:  no or low risk or alcohol related problems. Score between 8-15:  moderate risk of alcohol related problems. Score between 16-19:  high risk of alcohol related problems. Score 20 or above:  warrants further diagnostic evaluation for alcohol dependence and treatment.   CLINICAL FACTORS:   Depression:   Hopelessness Impulsivity Previous Psychiatric Diagnoses and Treatments     Psychiatric Specialty Exam: Physical Exam  ROS  Blood pressure 136/85, pulse 70, temperature 98.2 F (36.8 C), resp. rate 16, height 5\' 8"  (1.727 m), weight 77.1 kg (170 lb), SpO2 96 %.Body mass index is 25.85 kg/m.                                                    Sleep:  Number of Hours: 6.5      COGNITIVE FEATURES THAT CONTRIBUTE TO RISK:  Polarized thinking    SUICIDE RISK:   Moderate:  Frequent suicidal ideation with limited intensity, and duration, some specificity in terms of plans, no associated intent, good self-control, limited dysphoria/symptomatology, some risk factors present, and identifiable protective factors, including available and accessible social support.  PLAN OF CARE: admit to [facility]  I certify that  inpatient services furnished can reasonably be expected to improve the patient's condition.   Hildred Priest, MD 04/09/2017, 9:45 AM

## 2017-04-09 NOTE — H&P (Addendum)
Psychiatric Admission Assessment Adult  Patient Identification: Caleb Berg MRN:  [id number] Date of Evaluation:  04/09/2017 Chief Complaint:  major depressive disorder Principal Diagnosis: MDD (major depressive disorder), recurrent severe, without psychosis ([place]) Diagnosis:   Patient Active Problem List   Diagnosis Date Noted  . MDD (major depressive disorder), recurrent severe, without psychosis ([place]) [F33.2] 04/08/2017   History of Present Illness:   66 year old Caucasian male who presented to [facility] emergency department on June 22 with complaints of depression  Patient reported hyperphagia, feelings of guilt and low energy. He denied SI or HI. He also denied any psychotic symptoms. He denies having any history of symptoms consistent with mania or hypomania.  Patient is currently unemployed. He is on the verge of becoming homeless. His car was recently repossessed. He is unable to pay his bills. Patient was initially working 40 hours but his hours were decreased to 16 so he quit that job and has not been able to find another one.  Patient attempted suicide earlier this year by overdosing on sleeping medication. He was admitted to behavioral health in [place].  Trauma negative  Substance abuse: Patient started drinking 5 years ago. 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 He denies the use of any illicit substances or abusing alcohol.  Denies cigarette use  Denies access to guns.   Associated Signs/Symptoms: Depression Symptoms:  depressed mood, hypersomnia, hopelessness, (Hypo) Manic Symptoms:  denies Anxiety Symptoms:  Excessive Worry, Psychotic Symptoms:  denies PTSD Symptoms: NA Total Time spent with patient: 1 hour  Past Psychiatric History: Prior diagnosis of major depressive disorder given at behavioral health in [place] 3 months ago. He was admitted there after an overdose. He was restarted on Cymbalta and trazodone. He had been  following up outpatient but 2 weeks ago he stopped his medications because he was unable to afford them.  Is the patient at risk to self? Yes.    Has the patient been a risk to self in the past 6 months? No.  Has the patient been a risk to self within the distant past? No.  Is the patient a risk to others? No.  Has the patient been a risk to others in the past 6 months? No.  Has the patient been a risk to others within the distant past? No.    Alcohol Screening: 1. How often do you have a drink containing alcohol?: Never 9. Have you or someone else been injured as a result of your drinking?: No 10. Has a relative or friend or a doctor or another health worker been concerned about your drinking or suggested you cut down?: No Alcohol Use Disorder Identification Test Final Score (AUDIT): 0 Brief Intervention: AUDIT score less than 7 or less-screening does not suggest unhealthy drinking-brief intervention not indicated  Past Medical History: No history of head trauma No history of seizures Past Medical History:  Diagnosis Date  . Depression     Past Surgical History:  Procedure Laterality Date  . HERNIA REPAIR     Family History: History reviewed. No pertinent family history.   Family Psychiatric  History: Father suffered from depression. He also has a younger brother  And sister who were  diagnosed with depression  Tobacco Screening:  denies  Social History: Patient is divorced. He has one daughter who is 41 years old. No very close to her only occasional communication. Lives alone. He has 2 years of college education. 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 Denies having any legal history.  History  Alcohol Use No     History  Drug Use No    Additional Social History:                           Allergies:  No Known Allergies Lab Results:  Results for orders placed or performed during the hospital encounter of  04/07/17 (from the past 48 hour(s))  Comprehensive metabolic panel     Status: Abnormal   Collection Time: 04/07/17  9:50 PM  Result Value Ref Range   Sodium 139 135 - 145 mmol/L   Potassium 3.7 3.5 - 5.1 mmol/L   Chloride 101 101 - 111 mmol/L   CO2 28 22 - 32 mmol/L   Glucose, Bld 160 (H) 65 - 99 mg/dL   BUN 33 (H) 6 - 20 mg/dL   Creatinine, Ser 1.40 (H) 0.61 - 1.24 mg/dL   Calcium 9.3 8.9 - 10.3 mg/dL   Total Protein 7.3 6.5 - 8.1 g/dL   Albumin 4.2 3.5 - 5.0 g/dL   AST 32 15 - 41 U/L   ALT 23 17 - 63 U/L   Alkaline Phosphatase 69 38 - 126 U/L   Total Bilirubin 0.4 0.3 - 1.2 mg/dL   GFR calc non Af Amer 51 (L) >60 mL/min   GFR calc Af Amer 59 (L) >60 mL/min    Comment: (NOTE) The eGFR has been calculated using the CKD EPI equation. This calculation has not been validated in all clinical situations. eGFR's persistently <60 mL/min signify possible Chronic Kidney Disease.    Anion gap 10 5 - 15  Ethanol     Status: None   Collection Time: 04/07/17  9:50 PM  Result Value Ref Range   Alcohol, Ethyl (B) <5 <5 mg/dL    Comment:        LOWEST DETECTABLE LIMIT FOR SERUM ALCOHOL IS 5 mg/dL FOR MEDICAL PURPOSES ONLY   CBC with Diff     Status: Abnormal   Collection Time: 04/07/17  9:50 PM  Result Value Ref Range   WBC 10.4 4.0 - 10.5 K/uL   RBC 4.85 4.22 - 5.81 MIL/uL   Hemoglobin 13.6 13.0 - 17.0 g/dL   HCT 40.0 39.0 - 52.0 %   MCV 82.5 78.0 - 100.0 fL   MCH 28.0 26.0 - 34.0 pg   MCHC 34.0 30.0 - 36.0 g/dL   RDW 13.6 11.5 - 15.5 %   Platelets 177 150 - 400 K/uL   Neutrophils Relative % 65 %   Neutro Abs 6.8 1.7 - 7.7 K/uL   Lymphocytes Relative 20 %   Lymphs Abs 2.0 0.7 - 4.0 K/uL   Monocytes Relative 14 %   Monocytes Absolute 1.5 (H) 0.1 - 1.0 K/uL   Eosinophils Relative 1 %   Eosinophils Absolute 0.1 0.0 - 0.7 K/uL   Basophils Relative 0 %   Basophils Absolute 0.0 0.0 - 0.1 K/uL  Acetaminophen level     Status: Abnormal   Collection Time: 04/07/17  9:50 PM   Result Value Ref Range   Acetaminophen (Tylenol), Serum <10 (L) 10 - 30 ug/mL    Comment:        THERAPEUTIC CONCENTRATIONS VARY SIGNIFICANTLY. A RANGE OF 10-30 ug/mL MAY BE AN EFFECTIVE CONCENTRATION FOR MANY PATIENTS. HOWEVER, SOME ARE BEST TREATED AT CONCENTRATIONS OUTSIDE THIS RANGE. ACETAMINOPHEN CONCENTRATIONS >150 ug/mL AT 4 HOURS AFTER INGESTION  AND >50 ug/mL AT 12 HOURS AFTER INGESTION ARE OFTEN ASSOCIATED WITH TOXIC REACTIONS.   Salicylate level     Status: None   Collection Time: 04/07/17  9:50 PM  Result Value Ref Range   Salicylate Lvl <6.7 2.8 - 30.0 mg/dL  Urine rapid drug screen (hosp performed)     Status: None   Collection Time: 04/08/17  5:30 AM  Result Value Ref Range   Opiates NONE DETECTED NONE DETECTED   Cocaine NONE DETECTED NONE DETECTED   Benzodiazepines NONE DETECTED NONE DETECTED   Amphetamines NONE DETECTED NONE DETECTED   Tetrahydrocannabinol NONE DETECTED NONE DETECTED   Barbiturates NONE DETECTED NONE DETECTED    Comment:        DRUG SCREEN FOR MEDICAL PURPOSES ONLY.  IF CONFIRMATION IS NEEDED FOR ANY PURPOSE, NOTIFY LAB WITHIN 5 DAYS.        LOWEST DETECTABLE LIMITS FOR URINE DRUG SCREEN Drug Class       Cutoff (ng/mL) Amphetamine      1000 Barbiturate      200 Benzodiazepine   893 Tricyclics       810 Opiates          300 Cocaine          300 THC              50   Urinalysis, Routine w reflex microscopic     Status: Abnormal   Collection Time: 04/08/17  9:17 AM  Result Value Ref Range   Color, Urine YELLOW YELLOW   APPearance TURBID (A) CLEAR   Specific Gravity, Urine 1.027 1.005 - 1.030   pH 5.0 5.0 - 8.0   Glucose, UA NEGATIVE NEGATIVE mg/dL   Hgb urine dipstick NEGATIVE NEGATIVE   Bilirubin Urine NEGATIVE NEGATIVE   Ketones, ur NEGATIVE NEGATIVE mg/dL   Protein, ur 30 (A) NEGATIVE mg/dL   Nitrite NEGATIVE NEGATIVE   Leukocytes, UA NEGATIVE NEGATIVE   RBC / HPF 0-5 0 - 5 RBC/hpf   WBC, UA 0-5 0 - 5 WBC/hpf    Bacteria, UA NONE SEEN NONE SEEN   Squamous Epithelial / LPF NONE SEEN NONE SEEN   Mucous PRESENT    Hyaline Casts, UA PRESENT     Blood Alcohol level:  Lab Results  Component Value Date   ETH <5 04/07/2017   ETH <5 17/51/0258    Metabolic Disorder Labs:  Lab Results  Component Value Date   HGBA1C 6.4 (H) 01/12/2017   MPG 137 01/12/2017   Lab Results  Component Value Date   PROLACTIN 42.2 (H) 01/12/2017   Lab Results  Component Value Date   CHOL 258 (H) 01/12/2017   TRIG 190 (H) 01/12/2017   HDL 60 01/12/2017   CHOLHDL 4.3 01/12/2017   VLDL 38 01/12/2017   LDLCALC 160 (H) 01/12/2017    Current Medications: Current Facility-Administered Medications  Medication Dose Route Frequency Provider Last Rate Last Dose  . acetaminophen (TYLENOL) tablet 650 mg  650 mg Oral Q6H PRN Ethelene Hal, NP      . alum & mag hydroxide-simeth (MAALOX/MYLANTA) 200-200-20 MG/5ML suspension 30 mL  30 mL Oral Q4H PRN Ethelene Hal, NP      . DULoxetine (CYMBALTA) DR capsule 60 mg  60 mg Oral Daily Ethelene Hal, NP   60 mg at 04/09/17 0754  . magnesium hydroxide (MILK OF MAGNESIA) suspension 30 mL  30 mL Oral Daily PRN Ethelene Hal, NP      . traZODone ([place])  tablet 100 mg  100 mg Oral QHS PRN Ethelene Hal, NP       PTA Medications: Prescriptions Prior to Admission  Medication Sig Dispense Refill Last Dose  . DULoxetine (CYMBALTA) 20 MG capsule Take 1 capsule (20 mg total) by mouth 2 (two) times daily. For depression (Patient not taking: Reported on 04/07/2017) 60 capsule 0 Not Taking at Unknown time  . hydrOXYzine (ATARAX/VISTARIL) 25 MG tablet Take 1 tablet (25 mg total) by mouth every 6 (six) hours as needed for anxiety. (Patient not taking: Reported on 04/07/2017) 60 tablet 0 Not Taking at Unknown time  . traZODone (DESYREL) 100 MG tablet Take 1 tablet (100 mg total) by mouth at bedtime as needed for sleep. (Patient not taking: Reported on  04/07/2017) 30 tablet 0 Not Taking at Unknown time    Musculoskeletal: Strength & Muscle Tone: within normal limits Gait & Station: normal Patient leans: N/A  Psychiatric Specialty Exam: Physical Exam  Constitutional: He is oriented to person, place, and time. He appears well-developed and well-nourished.  HENT:  Head: Atraumatic.  Respiratory: Effort normal.  Musculoskeletal: Normal range of motion.  Neurological: He is alert and oriented to person, place, and time.    Review of Systems  Constitutional: Negative.   HENT: Negative.   Eyes: Negative.   Respiratory: Negative.   Cardiovascular: Negative.   Gastrointestinal: Negative.   Genitourinary: Negative.   Musculoskeletal: Negative.   Skin: Negative.   Neurological: Negative.   Endo/Heme/Allergies: Negative.   Psychiatric/Behavioral: Positive for depression. Negative for hallucinations, memory loss, substance abuse and suicidal ideas. The patient has insomnia. The patient is not nervous/anxious.     Blood pressure 136/85, pulse 70, temperature 98.2 F (36.8 C), resp. rate 16, height '5\' 8"'  (1.727 m), weight 77.1 kg (170 lb), SpO2 96 %.Body mass index is 25.85 kg/m.  General Appearance: Well Groomed  Eye Contact:  Good  Speech:  Clear and Coherent  Volume:  Normal  Mood:  Dysphoric  Affect:  Blunt  Thought Process:  Linear and Descriptions of Associations: Intact  Orientation:  Full (Time, Place, and Person)  Thought Content:  Logical  Suicidal Thoughts:  No  Homicidal Thoughts:  No  Memory:  Immediate;   Good Recent;   Good Remote;   Good  Judgement:  Fair  Insight:  Fair  Psychomotor Activity:  Decreased  Concentration:  Concentration: Good and Attention Span: Good  Recall:  Good  Fund of Knowledge:  Good  Language:  Good  Akathisia:  No  Handed:    AIMS (if indicated):     Assets:  Communication Skills Physical Health  ADL's:  Intact  Cognition:  WNL  Sleep:  Number of Hours: 6.5    Treatment Plan  Summary:  Patient is a 66 year old divorced Caucasian male with major depressive disorder, one prior suicidal attempt and very limited social support presents with worsening depression in the setting of severe financial distress and unemployment.  Major depressive disorder: Patient requests to be continued on Cymbalta 60 mg a day  Insomnia patient will be continued on trazodone 100 mg by mouth daily at bedtime  Labs I will order TSH and vitamin B12  Diet regular  Hospitalization and status voluntary  Precautions every 15 minute checks  Vital signs daily  Disposition was stable he will be discharged back to his home  Follow up patient will be schedule to f/u out with psychiatry.  Physician Treatment Plan for Primary Diagnosis: MDD (major depressive disorder), recurrent severe, without  psychosis ([place]) Long Term Goal(s): Improvement in symptoms so as ready for discharge  Short Term Goals: Ability to verbalize feelings will improve and Compliance with prescribed medications will improve  Physician Treatment Plan for Secondary Diagnosis: Principal Problem:   MDD (major depressive disorder), recurrent severe, without psychosis ([place])  Long Term Goal(s): Improvement in symptoms so as ready for discharge  Short Term Goals: Ability to identify and develop effective coping behaviors will improve  I certify that inpatient services furnished can reasonably be expected to improve the patient's condition.    Hildred Priest, MD 6/23/20188:41 AM

## 2017-04-09 NOTE — Progress Notes (Signed)
Patient was visible on the unit interacting with select peers and staff. Patient did attend unit group and went outside for fresh air. Patient affect brighten upon approach. Patient stated he did not have a good night because he didn't get any sleep, but did not ask for a prn because he thought he wouldn't have anything. Patient stated he will ask tonight, but a new order was put in for scheduled dose of trazodone. Patient denies any SI/HI/AH/VH. Patient is alert and oriented x 4, breathing unlabored, and extremities x 4 within normal limits. Patient is calm and cooperative. Patient did not display any disruptive behavior. Patient continues to be monitored on 15 minute safety checks. Will continue to monitor patient and notify MD of any changes.

## 2017-04-09 NOTE — Progress Notes (Signed)
Pt was observed to sleep about 6.5 hours, pt woke up at 0345. Pt had been sleeping during HS med's.  Pt was monitored on 15 minute safety checks.

## 2017-04-09 NOTE — [facility] Group Notes (Signed)
[place] LCSW Group Therapy  04/09/2017 1:53 PM  Type of Therapy:  Group Therapy  Participation Level:  Minimal  Participation Quality:  Attentive  Affect:  Appropriate  Cognitive:  Alert  Insight:  Limited  Engagement in Therapy:  Improving  Modes of Intervention:  Discussion, Education, Problem-solving, Reality Testing, Socialization and Support  Summary of Progress/Problems: Coping Skills: Patients defined and discussed healthy coping skills. Patients identified healthy coping skills they would like to try during hospitalization and after discharge. CSW offered insight to varying coping skills that may have been new to patients such as practicing mindfulness.  Xadrian Craighead G. [place], [place] 04/09/2017, 1:53 PM

## 2017-04-09 NOTE — Progress Notes (Signed)
D: Pt visible on the periphery of the unit.  Pt met with staff and asked if he could get a medication for sleep.  At medication time pt was sleeping.  Pt reports depression but denies s/i, h/i or hallucinations. A: Pt was provided support. Pt monitored on 15 minute safety checks and maintained safety, R: Pt slept at 2100 and was asleep when HS med's were due.  Pt awoke at 0345 and understood that he could not receive a sleep medication at this time.  Pt informed of sleep medication he could received and also encouraged to let staff know if he was not sleeping.  Monitor safety and cont tx plan.

## 2017-04-10 LAB — THYROID PANEL WITH TSH
Free Thyroxine Index: 1.6 (ref 1.2–4.9)
T3 Uptake Ratio: 26 % (ref 24–39)
T4, Total: 6.2 ug/dL (ref 4.5–12.0)
TSH: 2.43 u[IU]/mL (ref 0.450–4.500)

## 2017-04-10 MED ORDER — RISPERIDONE 0.25 MG PO TABS
0.2500 mg | ORAL_TABLET | Freq: Three times a day (TID) | ORAL | Status: DC
  Administered 2017-04-10 – 2017-04-13 (×9): 0.25 mg via ORAL
  Filled 2017-04-10 (×11): qty 1

## 2017-04-10 NOTE — Progress Notes (Signed)
Pleasant on approach, medication compliant, logical and coherent conversation. Interacted well with peers and staff. No issues to report on shift at this time.

## 2017-04-10 NOTE — Progress Notes (Signed)
[facility] MD Progress Note  04/10/2017 9:36 AM Caleb Berg  MRN:  [id number] Subjective:  66 year old Caucasian male who presented to [facility] emergency department on June 22 with complaints of depression  Patient reported hyperphagia, feelings of guilt and low energy. He denied SI or HI. He also denied any psychotic symptoms. He denies having any history of symptoms consistent with mania or hypomania.  Patient is currently unemployed. He is on the verge of becoming homeless. His car was recently repossessed. He is unable to pay his bills. Patient was initially working 40 hours but his hours were decreased to 16 so he quit that job and has not been able to find another one.  Patient attempted suicide earlier this year by overdosing on sleeping medication. He was admitted to behavioral health in [place].  6/24 patient denies having suicidality. He still appears very distressed about his problems at home. His mood was somewhat irritable. He denies problems with sleep, appetite, energy or concentration. Denies hallucinations. Denies homicidal ideation or physical complaints. Denies side effects from medications.  Family was contacted by Education officer, museum. He reported the patient has a history of gambling and had debt even before he lost his job. They're willing to help with co-pays from appointments and medications.  Per nursing: Pleasant on approach, medication compliant, logical and coherent conversation. Interacted well with peers and staff. No issues to report on shift at this time.  Patient was visible on the unit interacting with select peers and staff. Patient did attend unit group and went outside for fresh air. Patient affect brighten upon approach. Patient stated he did not have a good night because he didn't get any sleep, but did not ask for a prn because he thought he wouldn't have anything. Patient stated he will ask tonight, but a new order was put in for scheduled dose of trazodone.  Patient denies any SI/HI/AH/VH. Patient is alert and oriented x 4, breathing unlabored, and extremities x 4 within normal limits. Patient is calm and cooperative. Patient did not display any disruptive behavior. Patient continues to be monitored on 15 minute safety checks. Will continue to monitor patient and notify MD of any changes.  Principal Problem: MDD (major depressive disorder), recurrent severe, without psychosis ([place]) Diagnosis:   Patient Active Problem List   Diagnosis Date Noted  . MDD (major depressive disorder), recurrent severe, without psychosis ([place]) [F33.2] 04/08/2017   Total Time spent with patient: 30 minutes  Past Psychiatric History: Prior diagnosis of major depressive disorder given at behavioral health in [place] 3 months ago. He was admitted there after an overdose. He was restarted on Cymbalta and trazodone. He had been following up outpatient but 2 weeks ago he stopped his medications because he was unable to afford them.  Past Medical History:  Past Medical History:  Diagnosis Date  . Depression     Past Surgical History:  Procedure Laterality Date  . HERNIA REPAIR     Family History: History reviewed. No pertinent family history.   Family Psychiatric  History: Father suffered from depression. He also has a younger brother  And sister who were  diagnosed with depression  Social History:  Patient is divorced. He has one daughter who is 53 years old. No very close to her only occasional communication. Lives alone. He has 2 years of college education. 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 Denies having any legal history.  History  Alcohol Use No  History  Drug Use No    Social History   Social History  . Marital status: Married    Spouse name: N/A  . Number of children: N/A  . Years of education: N/A   Social History Main Topics  . Smoking status: Never Smoker  . Smokeless tobacco: Never  Used  . Alcohol use No  . Drug use: No  . Sexual activity: Not Currently    Birth control/ protection: Abstinence   Other Topics Concern  . None   Social History Narrative  . None     Current Medications: Current Facility-Administered Medications  Medication Dose Route Frequency Provider Last Rate Last Dose  . acetaminophen (TYLENOL) tablet 650 mg  650 mg Oral Q6H PRN Ethelene Hal, NP      . alum & mag hydroxide-simeth (MAALOX/MYLANTA) 200-200-20 MG/5ML suspension 30 mL  30 mL Oral Q4H PRN Ethelene Hal, NP      . DULoxetine (CYMBALTA) DR capsule 60 mg  60 mg Oral Daily Ethelene Hal, NP   60 mg at 04/10/17 1962  . magnesium hydroxide (MILK OF MAGNESIA) suspension 30 mL  30 mL Oral Daily PRN Ethelene Hal, NP      . traZODone (DESYREL) tablet 100 mg  100 mg Oral QHS Hildred Priest, MD   100 mg at 04/09/17 2146    Lab Results:  Results for orders placed or performed during the hospital encounter of 04/08/17 (from the past 48 hour(s))  Thyroid Panel With TSH     Status: None   Collection Time: 04/09/17 10:18 AM  Result Value Ref Range   TSH 2.430 0.450 - 4.500 uIU/mL   T4, Total 6.2 4.5 - 12.0 ug/dL   T3 Uptake Ratio 26 24 - 39 %   Free Thyroxine Index 1.6 1.2 - 4.9    Comment: (NOTE) Performed At: [facility] [place], [place] [id number] Lindon Romp MD JH:[id number]   Vitamin B12     Status: None   Collection Time: 04/09/17 10:18 AM  Result Value Ref Range   Vitamin B-12 403 180 - 914 pg/mL    Comment: (NOTE) This assay is not validated for testing neonatal or myeloproliferative syndrome specimens for Vitamin B12 levels. Performed at [facility], [place] [address] [place], [place] [address]     Blood Alcohol level:  Lab Results  Component Value Date   [facility] <5 04/07/2017   ETH <5 14/97/0263    Metabolic Disorder Labs: Lab Results  Component Value Date   HGBA1C 6.4 (H) 01/12/2017    MPG 137 01/12/2017   Lab Results  Component Value Date   PROLACTIN 42.2 (H) 01/12/2017   Lab Results  Component Value Date   CHOL 258 (H) 01/12/2017   TRIG 190 (H) 01/12/2017   HDL 60 01/12/2017   CHOLHDL 4.3 01/12/2017   VLDL 38 01/12/2017   LDLCALC 160 (H) 01/12/2017    Physical Findings: AIMS: Facial and Oral Movements Muscles of Facial Expression: None, normal Lips and Perioral Area: None, normal Jaw: None, normal Tongue: None, normal,Extremity Movements Upper (arms, wrists, hands, fingers): None, normal Lower (legs, knees, ankles, toes): None, normal, Trunk Movements Neck, shoulders, hips: None, normal, Overall Severity Severity of abnormal movements (highest score from questions above): None, normal Incapacitation due to abnormal movements: None, normal Patient's awareness of abnormal movements (rate only patient's report): No Awareness, Dental Status Current problems with teeth and/or dentures?: No Does patient usually wear dentures?: No  CIWA:  COWS:     Musculoskeletal: Strength & Muscle Tone: within normal limits Gait & Station: normal Patient leans: N/A  Psychiatric Specialty Exam: Physical Exam  Constitutional: He is oriented to person, place, and time. He appears well-developed and well-nourished.  HENT:  Head: Normocephalic and atraumatic.  Eyes: Conjunctivae and EOM are normal.  Neck: Normal range of motion.  Respiratory: Effort normal.  Musculoskeletal: Normal range of motion.  Neurological: He is alert and oriented to person, place, and time.    Review of Systems  Constitutional: Negative.   HENT: Negative.   Eyes: Negative.   Respiratory: Negative.   Cardiovascular: Negative.   Gastrointestinal: Negative.   Genitourinary: Negative.   Musculoskeletal: Negative.   Skin: Negative.   Neurological: Negative.   Endo/Heme/Allergies: Negative.   Psychiatric/Behavioral: Positive for depression. Negative for suicidal ideas.    Blood pressure  117/84, pulse 72, temperature 97.8 F (36.6 C), resp. rate 16, height 5\' 8"  (1.727 m), weight 77.1 kg (170 lb), SpO2 96 %.Body mass index is 25.85 kg/m.  General Appearance: Well Groomed  Eye Contact:  Good  Speech:  Clear and Coherent  Volume:  Normal  Mood:  Dysphoric  Affect:  Blunt  Thought Process:  Linear and Descriptions of Associations: Intact  Orientation:  Full (Time, Place, and Person)  Thought Content:  Hallucinations: None  Suicidal Thoughts:  No  Homicidal Thoughts:  No  Memory:  Immediate;   Good Recent;   Good Remote;   Good  Judgement:  Poor  Insight:  Shallow  Psychomotor Activity:  Decreased  Concentration:  Concentration: Fair and Attention Span: Fair  Recall:  Good  Fund of Knowledge:  Good  Language:  Good  Akathisia:  No  Handed:    AIMS (if indicated):     Assets:  Communication Skills  ADL's:  Intact  Cognition:  WNL  Sleep:  Number of Hours: 7.5     Treatment Plan Summary:  Patient is a 66 year old divorced Caucasian male with major depressive disorder, one prior suicidal attempt and very limited social support presents with worsening depression in the setting of severe financial distress and unemployment.  Major depressive disorder: Patient requests to be continued on Cymbalta 60 mg a day  Agitation: I will add Risperdal 0.25 mg 3 times a day to target agitation. The patient appeared to be in distress today, preoccupied about his problems  Insomnia:  continued on trazodone 100 mg by mouth daily at bedtime  Labs I will order TSH and vitamin B12---wnl  Diet regular  Hospitalization and status voluntary  Precautions every 15 minute checks  Vital signs daily  Disposition was stable he will be discharged back to his home  Follow up patient will be schedule to f/u out with psychiatry.   Hildred Priest, MD 04/10/2017, 9:36 AM

## 2017-04-10 NOTE — Progress Notes (Signed)
Patient was visible on the unit sitting in dayroom interacting with peers and watching TV. Patient reported some depression, but denies SI/HI/AH/VH. Patient stated he is having a better day. Patient is alert and oriented x 4, breathing unlabored, and extremities x 4 within normal limits. Patient is calm and cooperative. Patient did not display any disruptive behavior.Patient continues to be monitored on 15 minute safety checks. Will continue to monitor patient and notify MD of any changes.

## 2017-04-10 NOTE — Plan of Care (Signed)
Problem: Coping: Goal: Ability to verbalize feelings will improve Outcome: Progressing Patient is able to verbalize feelings of depressing or any negative thoughts and will alert staff if he has any concerns. Will continue to monitor.

## 2017-04-10 NOTE — BHH Group Notes (Signed)
[place] Group Notes:  (Nursing/MHT/Case Management/Adjunct)  Date:  04/10/2017  Time:  10:40 PM  Type of Therapy:  Psychoeducational Skills  Participation Level:  Did Not Attend   Kathi Ludwig 04/10/2017, 10:40 PM

## 2017-04-10 NOTE — BHH Group Notes (Signed)
[place] LCSW Group Therapy  04/10/2017 2:35 PM  Type of Therapy:  Group Therapy  Participation Level:  Patient did not attend group. CSW invited patient to group.   Summary of Progress/Problems: Communications: Patients identify how individuals communicate with one another appropriately and inappropriately. Patients will be guided to discuss their thoughts, feelings, and behaviors related to barriers when communicating. The group will process together ways to execute positive and appropriate communications.   Rainee Sweatt G. Kings, [place] 04/10/2017, 2:35 PM

## 2017-04-11 NOTE — Tx Team (Addendum)
Interdisciplinary Treatment and Diagnostic Plan Update  04/11/2017 Time of Session: 11:20AM Caleb Berg MRN: [id number]  Principal Diagnosis: MDD (major depressive disorder), recurrent severe, without psychosis ([place])  Secondary Diagnoses: Principal Problem:   MDD (major depressive disorder), recurrent severe, without psychosis ([place])   Current Medications:  Current Facility-Administered Medications  Medication Dose Route Frequency Provider Last Rate Last Dose  . acetaminophen (TYLENOL) tablet 650 mg  650 mg Oral Q6H PRN Ethelene Hal, NP      . alum & mag hydroxide-simeth (MAALOX/MYLANTA) 200-200-20 MG/5ML suspension 30 mL  30 mL Oral Q4H PRN Ethelene Hal, NP      . DULoxetine (CYMBALTA) DR capsule 60 mg  60 mg Oral Daily Ethelene Hal, NP   60 mg at 04/11/17 0847  . magnesium hydroxide (MILK OF MAGNESIA) suspension 30 mL  30 mL Oral Daily PRN Ethelene Hal, NP      . risperiDONE (RISPERDAL) tablet 0.25 mg  0.25 mg Oral TID Hildred Priest, MD   0.25 mg at 04/11/17 0847  . traZODone (DESYREL) tablet 100 mg  100 mg Oral QHS Hildred Priest, MD   100 mg at 04/10/17 2243   PTA Medications: Prescriptions Prior to Admission  Medication Sig Dispense Refill Last Dose  . DULoxetine (CYMBALTA) 20 MG capsule Take 1 capsule (20 mg total) by mouth 2 (two) times daily. For depression (Patient not taking: Reported on 04/07/2017) 60 capsule 0 Not Taking at Unknown time  . hydrOXYzine (ATARAX/VISTARIL) 25 MG tablet Take 1 tablet (25 mg total) by mouth every 6 (six) hours as needed for anxiety. (Patient not taking: Reported on 04/07/2017) 60 tablet 0 Not Taking at Unknown time  . traZODone (DESYREL) 100 MG tablet Take 1 tablet (100 mg total) by mouth at bedtime as needed for sleep. (Patient not taking: Reported on 04/07/2017) 30 tablet 0 Not Taking at Unknown time    Patient Stressors: Financial difficulties Occupational concerns  Patient  Strengths: Active sense of humor Average or above average intelligence Motivation for treatment/growth  Treatment Modalities: Medication Management, Group therapy, Case management,  1 to 1 session with clinician, Psychoeducation, Recreational therapy.   Physician Treatment Plan for Primary Diagnosis: MDD (major depressive disorder), recurrent severe, without psychosis ([place]) Long Term Goal(s): Improvement in symptoms so as ready for discharge Improvement in symptoms so as ready for discharge   Short Term Goals: Ability to verbalize feelings will improve Compliance with prescribed medications will improve Ability to identify and develop effective coping behaviors will improve  Medication Management: Evaluate patient's response, side effects, and tolerance of medication regimen.  Therapeutic Interventions: 1 to 1 sessions, Unit Group sessions and Medication administration.  Evaluation of Outcomes: Progressing  Physician Treatment Plan for Secondary Diagnosis: Principal Problem:   MDD (major depressive disorder), recurrent severe, without psychosis ([place])  Long Term Goal(s): Improvement in symptoms so as ready for discharge Improvement in symptoms so as ready for discharge   Short Term Goals: Ability to verbalize feelings will improve Compliance with prescribed medications will improve Ability to identify and develop effective coping behaviors will improve     Medication Management: Evaluate patient's response, side effects, and tolerance of medication regimen.  Therapeutic Interventions: 1 to 1 sessions, Unit Group sessions and Medication administration.  Evaluation of Outcomes: Progressing   RN Treatment Plan for Primary Diagnosis: MDD (major depressive disorder), recurrent severe, without psychosis ([place]) Long Term Goal(s): Knowledge of disease and therapeutic regimen to maintain health will improve  Short Term Goals: Ability to  demonstrate self-control, Ability to disclose and  discuss suicidal ideas and Compliance with prescribed medications will improve  Medication Management: RN will administer medications as ordered by provider, will assess and evaluate patient's response and provide education to patient for prescribed medication. RN will report any adverse and/or side effects to prescribing provider.  Therapeutic Interventions: 1 on 1 counseling sessions, Psychoeducation, Medication administration, Evaluate responses to treatment, Monitor vital signs and CBGs as ordered, Perform/monitor CIWA, COWS, AIMS and Fall Risk screenings as ordered, Perform wound care treatments as ordered.  Evaluation of Outcomes: Progressing   LCSW Treatment Plan for Primary Diagnosis: MDD (major depressive disorder), recurrent severe, without psychosis ([place]) Long Term Goal(s): Safe transition to appropriate next level of care at discharge, Engage patient in therapeutic group addressing interpersonal concerns.  Short Term Goals: Engage patient in aftercare planning with referrals and resources, Increase social support and Increase emotional regulation  Therapeutic Interventions: Assess for all discharge needs, 1 to 1 time with Social worker, Explore available resources and support systems, Assess for adequacy in community support network, Educate family and significant other(s) on suicide prevention, Complete Psychosocial Assessment, Interpersonal group therapy.  Evaluation of Outcomes: Progressing    Recreational Therapy Treatment Plan for Primary Diagnosis: MDD (major depressive disorder), recurrent severe, without psychosis ([place]) Long Term Goal(s): Patient will participate in recreation therapy treatment in at least 2 group sessions without prompting from LRT  Short Term Goals: Increase self-esteem, Increase stress management skills  Treatment Modalities: Group Therapy and Individual Treatment Sessions  Therapeutic Interventions: Psychoeducation  Evaluation of Outcomes:  Progressing   Progress in Treatment: Attending groups: Yes. Participating in groups: Yes. Taking medication as prescribed: Yes. Toleration medication: Yes. Family/Significant other contact made: Yes, individual(s) contacted:  CSW spoke with patient's brother Patient understands diagnosis: Yes. Discussing patient identified problems/goals with staff: Yes. Medical problems stabilized or resolved: Yes. Denies suicidal/homicidal ideation: Yes. Issues/concerns per patient self-inventory: No.  New problem(s) identified: No, Describe:  None identified  New Short Term/Long Term Goal(s): Patient stated that his goal is to find a job at discharge.  Discharge Plan or Barriers: CSW assessing appropriate discharge plan.  Reason for Continuation of Hospitalization: Depression Suicidal ideation  Estimated Length of Stay: 3 days   Attendees: Patient: Caleb Berg  04/11/2017 11:26 AM  Physician: Dr. Merlyn Albert, MD  04/11/2017 11:26 AM  Nursing:  04/11/2017 11:26 AM  RN Care Manager: 04/11/2017 11:26 AM  Social Worker: Glorious Peach, MSW, LCSW-A 04/11/2017 11:26 AM  Recreational Therapist: Drue Flirt, LRT/CTRS  04/11/2017 11:26 AM  Other: Pasty Spillers, PA Student  04/11/2017 11:26 AM  Other:  04/11/2017 11:26 AM  Other: 04/11/2017 11:26 AM    Scribe for Treatment Team: Emilie Rutter, [place] 04/11/2017 11:26 AM

## 2017-04-11 NOTE — Progress Notes (Signed)
Recreation Therapy Notes  Date: 06.25.18 Time: 9:30 am Location: Craft Room  Group Topic: Wellness  Goal Area(s) Addresses:  Patient will identify at least one item per dimension of health. Patient will examine areas they are deficient in.  Behavioral Response: Attentive, Interactive  Intervention: 6 Dimensions of Health  Activity: Patients were given a definition sheet defining the 6 dimensions of health and a worksheet with each dimension listed. Patients were instructed to write at least one item they were currently doing in each dimension.  Education: LRT educated patients on ways to improve each dimension.  Education Outcome: Acknowledges education/In group clarification offered  Clinical Observations/Feedback: Patient had to leave group to get medications. Patient returned to group around 1:17 pm. LRT explained activity. Patient wrote one item in one dimension. Patient contributed to group discussion by stating how this activity relates to his d/c.  Leonette Monarch, LRT/CTRS 04/11/2017 1:41 PM

## 2017-04-11 NOTE — BHH Group Notes (Signed)
[place] LCSW Group Therapy   04/11/2017 9:30am Type of Therapy: Group Therapy   Participation Level: Active   Participation Quality: Attentive, Sharing and Supportive   Affect: Appropriate    Cognitive: Alert and Oriented   Insight: Developing/Improving and Engaged   Engagement in Therapy: Developing/Improving and Engaged   Modes of Intervention: Clarification, Confrontation, Discussion, Education, Exploration,  Limit-setting, Orientation, Problem-solving, Rapport Building, Art therapist, Socialization and Support   Summary of Progress/Problems: Pt identified obstacles faced currently and processed barriers involved in overcoming these obstacles. Pt identified steps necessary for overcoming these obstacles and explored motivation (internal and external) for facing these difficulties head on. Pt further identified one area of concern in their lives and chose a goal to focus on for today. Patient defined what an obstacle is and set goals for overcoming some of the obstacles that got in his way of recovery. CSw provided support to patient.  Glorious Peach, MSW, LCSW-A 04/11/2017, 11:52AM

## 2017-04-11 NOTE — Progress Notes (Signed)
[facility] MD Progress Note  04/11/2017 10:18 AM Caleb Berg  MRN:  [id number] Subjective:  66 year old Caucasian male who presented to [facility] emergency department on June 22 with complaints of depression  Patient reported hyperphagia, feelings of guilt and low energy. He denied SI or HI. He also denied any psychotic symptoms. He denies having any history of symptoms consistent with mania or hypomania.  Patient is currently unemployed. He is on the verge of becoming homeless. His car was recently repossessed. He is unable to pay his bills. Patient was initially working 40 hours but his hours were decreased to 16 so he quit that job and has not been able to find another one.  Patient attempted suicide earlier this year by overdosing on sleeping medication. He was admitted to behavioral health in [place].  6/24 patient denies having suicidality. He still appears very distressed about his problems at home. His mood was somewhat irritable. He denies problems with sleep, appetite, energy or concentration. Denies hallucinations. Denies homicidal ideation or physical complaints. Denies side effects from medications.  Family was contacted by Education officer, museum. He reported the patient has a history of gambling and had debt even before he lost his job. They're willing to help with co-pays from appointments and medications.  6/25 patient appears still to be dysphoric and at times irritable. He tells me he continues to have passive suicidal ideation, he wishes he will go to sleep and never had to wake up. He denies any side effects from medications or having any physical complaints. He denies homicidal ideation or psychosis. He denies problems with appetite, energy, sleep or concentration. Still overwhelmed about his financial issues. The patient explains that he has already moved out of his apartment and for the last few days he had been staying in a motel. He had not told his family.  Per nursing: D: Pt  denies SI/HI/AVh. Pt is pleasant and cooperative. Pt stayed in room most of the evening, pt came out to get his medication. Pt stated he was ok, pt very minimal with his interaction and forwards little information at this time.   A: Pt was offered support and encouragement. Pt was given scheduled medications. Pt was encourage to attend groups. Q 15 minute checks were done for safety.   R: safety maintained on unit.  Principal Problem: MDD (major depressive disorder), recurrent severe, without psychosis ([place]) Diagnosis:   Patient Active Problem List   Diagnosis Date Noted  . MDD (major depressive disorder), recurrent severe, without psychosis ([place]) [F33.2] 04/08/2017   Total Time spent with patient: 30 minutes  Past Psychiatric History: Prior diagnosis of major depressive disorder given at behavioral health in [place] 3 months ago. He was admitted there after an overdose. He was restarted on Cymbalta and trazodone. He had been following up outpatient but 2 weeks ago he stopped his medications because he was unable to afford them.  Past Medical History:  Past Medical History:  Diagnosis Date  . Depression     Past Surgical History:  Procedure Laterality Date  . HERNIA REPAIR     Family History: History reviewed. No pertinent family history.   Family Psychiatric  History: Father suffered from depression. He also has a younger brother  And sister who were  diagnosed with depression  Social History:  Patient is divorced. He has one daughter who is 32 years old. No very close to her only occasional communication. Lives alone. He has 2 years of college education. 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 Denies having any legal history.  History  Alcohol Use No     History  Drug Use No    Social History   Social History  . Marital status: Married    Spouse name: N/A  . Number of children: N/A  . Years of education: N/A    Social History Main Topics  . Smoking status: Never Smoker  . Smokeless tobacco: Never Used  . Alcohol use No  . Drug use: No  . Sexual activity: Not Currently    Birth control/ protection: Abstinence   Other Topics Concern  . None   Social History Narrative  . None     Current Medications: Current Facility-Administered Medications  Medication Dose Route Frequency Provider Last Rate Last Dose  . acetaminophen (TYLENOL) tablet 650 mg  650 mg Oral Q6H PRN Ethelene Hal, NP      . alum & mag hydroxide-simeth (MAALOX/MYLANTA) 200-200-20 MG/5ML suspension 30 mL  30 mL Oral Q4H PRN Ethelene Hal, NP      . DULoxetine (CYMBALTA) DR capsule 60 mg  60 mg Oral Daily Ethelene Hal, NP   60 mg at 04/11/17 0847  . magnesium hydroxide (MILK OF MAGNESIA) suspension 30 mL  30 mL Oral Daily PRN Ethelene Hal, NP      . risperiDONE (RISPERDAL) tablet 0.25 mg  0.25 mg Oral TID Hildred Priest, MD   0.25 mg at 04/11/17 0847  . traZODone (DESYREL) tablet 100 mg  100 mg Oral QHS Hildred Priest, MD   100 mg at 04/10/17 2243    Lab Results:  No results found for this or any previous visit (from the past 48 hour(s)).  Blood Alcohol level:  Lab Results  Component Value Date   ETH <5 04/07/2017   ETH <5 11/91/4782    Metabolic Disorder Labs: Lab Results  Component Value Date   HGBA1C 6.4 (H) 01/12/2017   MPG 137 01/12/2017   Lab Results  Component Value Date   PROLACTIN 42.2 (H) 01/12/2017   Lab Results  Component Value Date   CHOL 258 (H) 01/12/2017   TRIG 190 (H) 01/12/2017   HDL 60 01/12/2017   CHOLHDL 4.3 01/12/2017   VLDL 38 01/12/2017   LDLCALC 160 (H) 01/12/2017    Physical Findings: AIMS: Facial and Oral Movements Muscles of Facial Expression: None, normal Lips and Perioral Area: None, normal Jaw: None, normal Tongue: None, normal,Extremity Movements Upper (arms, wrists, hands, fingers): None, normal Lower (legs,  knees, ankles, toes): None, normal, Trunk Movements Neck, shoulders, hips: None, normal, Overall Severity Severity of abnormal movements (highest score from questions above): None, normal Incapacitation due to abnormal movements: None, normal Patient's awareness of abnormal movements (rate only patient's report): No Awareness, Dental Status Current problems with teeth and/or dentures?: No Does patient usually wear dentures?: No  CIWA:    COWS:     Musculoskeletal: Strength & Muscle Tone: within normal limits Gait & Station: normal Patient leans: N/A  Psychiatric Specialty Exam: Physical Exam  Constitutional: He is oriented to person, place, and time. He appears well-developed and well-nourished.  HENT:  Head: Normocephalic and atraumatic.  Eyes: Conjunctivae and EOM are normal.  Neck: Normal range of motion.  Respiratory: Effort normal.  Musculoskeletal: Normal range of motion.  Neurological: He is alert and oriented to person, place, and time.    Review of Systems  Constitutional: Negative.   HENT: Negative.   Eyes:  Negative.   Respiratory: Negative.   Cardiovascular: Negative.   Gastrointestinal: Negative.   Genitourinary: Negative.   Musculoskeletal: Negative.   Skin: Negative.   Neurological: Negative.   Endo/Heme/Allergies: Negative.   Psychiatric/Behavioral: Positive for depression. Negative for suicidal ideas.    Blood pressure 116/81, pulse 70, temperature 97.6 F (36.4 C), temperature source Oral, resp. rate 18, height 5\' 8"  (1.727 m), weight 77.1 kg (170 lb), SpO2 96 %.Body mass index is 25.85 kg/m.  General Appearance: Well Groomed  Eye Contact:  Good  Speech:  Clear and Coherent  Volume:  Normal  Mood:  Dysphoric  Affect:  Blunt  Thought Process:  Linear and Descriptions of Associations: Intact  Orientation:  Full (Time, Place, and Person)  Thought Content:  Hallucinations: None  Suicidal Thoughts:  No  Homicidal Thoughts:  No  Memory:  Immediate;    Good Recent;   Good Remote;   Good  Judgement:  Poor  Insight:  Shallow  Psychomotor Activity:  Decreased  Concentration:  Concentration: Fair and Attention Span: Fair  Recall:  Good  Fund of Knowledge:  Good  Language:  Good  Akathisia:  No  Handed:    AIMS (if indicated):     Assets:  Communication Skills  ADL's:  Intact  Cognition:  WNL  Sleep:  Number of Hours: 7.3     Treatment Plan Summary:  Patient is a 66 year old divorced Caucasian male with major depressive disorder, one prior suicidal attempt and very limited social support presents with worsening depression in the setting of severe financial distress and unemployment.  Major depressive disorder: Patient requests to be continued on Cymbalta 60 mg a day  Agitation: Continue Risperdal 0.25 mg 3 times a day for now  Insomnia:  continued on trazodone 100 mg by mouth daily at bedtime  Labs I will order TSH and vitamin B12---wnl  Diet regular  Hospitalization and status voluntary  Precautions every 15 minute checks  Vital signs daily  Disposition was stable he will be discharged back to his home  Follow up patient will be schedule to f/u out with psychiatry.  Contacted the patient's brother today. Brother did not answer.   Hildred Priest, MD 04/11/2017, 10:18 AM

## 2017-04-11 NOTE — Progress Notes (Signed)
D: Pt denies SI/HI/AVh. Pt is pleasant and cooperative. Pt stayed in room most of the evening, pt came out to get his medication. Pt stated he was ok, pt very minimal with his interaction and forwards little information at this time.   A: Pt was offered support and encouragement. Pt was given scheduled medications. Pt was encourage to attend groups. Q 15 minute checks were done for safety.   R: safety maintained on unit.

## 2017-04-11 NOTE — Progress Notes (Signed)
Recreation Therapy Notes  INPATIENT RECREATION THERAPY ASSESSMENT  Patient Details Name: Caleb Berg MRN: [id number] DOB: Jan 24, 1951 Today's Date: 04/11/2017  Patient Stressors: Work, Other (Comment) (No job, finances, car repossessed, no place to live)  Coping Skills:   Isolate, Avoidance, Exercise, Talking, Music, Sports, Other (Comment) (Movies, reading)  Personal Challenges: Anger, Relationships, Self-Esteem/Confidence, Stress Management  Leisure Interests (2+):  Sports - Golf, Exercise - Walking  Awareness of Community Resources:  No  Community Resources:     Current Use:    If no, Barriers?:    Patient Strengths:  Likes to help people, giving person  Patient Identified Areas of Improvement:  Anger - gets too excited and into it when watching a movie, better social skills  Current Recreation Participation:  Played golf  Patient Goal for Hospitalization:  To get to a stable level where he can be appy with himself and find a job  [place] of Residence:  [place] of Residence:  [place]   Current SI (including self-harm):  No  Current HI:  No  Consent to Intern Participation: N/A   Leonette Monarch, LRT/CTRS 04/11/2017, 4:22 PM

## 2017-04-11 NOTE — Plan of Care (Signed)
Problem: Safety: Goal: Ability to remain free from injury will improve Outcome: Progressing Pt remains free from injury.     

## 2017-04-11 NOTE — Progress Notes (Signed)
Pt calm and cooperative. Pt denies SI, HI, a/v hallucinations. Pt stated, " My mood is about the same". Pt does not elaborate much on feelings. He commits to safety on unit. Pt compliant with cymbalta and risperdal. Will continue to monitor.

## 2017-04-11 NOTE — Progress Notes (Signed)
[facility] MD Progress Note  04/11/2017 1:44 PM Caleb Berg  MRN:  [id number] Subjective:   66 year old Caucasian male who presented to [facility] emergency department on June 22 with complaints of depression  Patient reportedhyperphagia, feelings of guilt and low energy. He denied SI or HI. He also denied any psychotic symptoms. He denies having any history of symptoms consistent with mania or hypomania.  Patient is currently unemployed. He is on the verge of becoming homeless. His car was recently repossessed. He is unable to pay his bills. Patient was initially working 40 hours but his hours were decreased to 16 so he quit that job and has not been able to find another one.  Patient attempted suicide earlier this year by overdosing on sleeping medication. He was admitted to behavioral health in [place].  6/24 patient denies having suicidality. He still appears very distressed about his problems at home. His mood was somewhat irritable. He denies problems with sleep, appetite, energy or concentration. Denies hallucinations. Denies homicidal ideation or physical complaints. Denies side effects from medications.  Family was contacted by Education officer, [employer]. He reported the patient has a history of gambling and had debt even before he lost his job. They're willing to help with co-pays from appointments and medications.  6/25 pt appears dysphoric at times, and remains anxious, depressed and continues to have passive suicidal ideation. He said he "wishes to fall asleep and never wake up," he is indifferent to living.  Denies any homicidal ideation, psychosis, or problems with sleeping, energy level, concentration or appetite.  He stated that his depression began about 7 years ago after his divorce and has gotten worse, but he had not seek out treatment. He reports that he was unable to pay his rent, so he moved out of his apartment a few days ago and has been living in a hotel since.  He still has yet  to contact his brother about living situations upon discharge.   Per nursing: D:Pt denies SI/HI/AVh. Pt is pleasant and cooperative. Pt stayed in room most of the evening, pt came out to get his medication. Pt stated he was ok, pt very minimal with his interaction and forwards little information at this time.    A:Pt was offered support and encouragement. Pt was given scheduled medications. Pt was encourage to attend groups. Q 15 minute checks were done for safety.   R:safety maintained on unit.   Principal Problem: MDD (major depressive disorder), recurrent severe, without psychosis ([place]) Diagnosis:   Patient Active Problem List   Diagnosis Date Noted  . MDD (major depressive disorder), recurrent severe, without psychosis ([place]) [F33.2] 04/08/2017   Total Time spent with patient: 30 minutes  Past Psychiatric History:  Prior diagnosis of major depressive disorder given at behavioral health in [place] 3 months ago. He was admitted there after an overdose. He was restarted on Cymbalta and trazodone. He had been following up outpatient but 2 weeks ago he stopped his medications because he was unable to afford them    Past Medical History:  Past Medical History:  Diagnosis Date  . Depression     Past Surgical History:  Procedure Laterality Date  . HERNIA REPAIR     Family History: History reviewed. No pertinent family history. Family Psychiatric  History:   Father suffered from depression. He also has a younger brother And sister who were diagnosed with depression  Social History:  History  Alcohol Use No     History  Drug Use No  Social History   Social History  . Marital status: Married    Spouse name: N/A  . Number of children: N/A  . Years of education: N/A   Social History Main Topics  . Smoking status: Never Smoker  . Smokeless tobacco: Never Used  . Alcohol use No  . Drug use: No  . Sexual activity: Not Currently    Birth control/ protection:  Abstinence   Other Topics Concern  . None   Social History Narrative  . None   Additional Social History:     Patient is divorced. He has one daughter who is 54 years old. No very close to her only occasional communication. Lives alone. He has 2 years of college education. 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                      Sleep: Good  Appetite:  Good  Current Medications: Current Facility-Administered Medications  Medication Dose Route Frequency Provider Last Rate Last Dose  . acetaminophen (TYLENOL) tablet 650 mg  650 mg Oral Q6H PRN Ethelene Hal, NP      . alum & mag hydroxide-simeth (MAALOX/MYLANTA) 200-200-20 MG/5ML suspension 30 mL  30 mL Oral Q4H PRN Ethelene Hal, NP      . DULoxetine (CYMBALTA) DR capsule 60 mg  60 mg Oral Daily Ethelene Hal, NP   60 mg at 04/11/17 0847  . magnesium hydroxide (MILK OF MAGNESIA) suspension 30 mL  30 mL Oral Daily PRN Ethelene Hal, NP      . risperiDONE (RISPERDAL) tablet 0.25 mg  0.25 mg Oral TID Hildred Priest, MD   0.25 mg at 04/11/17 1316  . traZODone (DESYREL) tablet 100 mg  100 mg Oral QHS Hildred Priest, MD   100 mg at 04/10/17 2243    Lab Results: No results found for this or any previous visit (from the past 48 hour(s)).  Blood Alcohol level:  Lab Results  Component Value Date   ETH <5 04/07/2017   ETH <5 02/58/5277    Metabolic Disorder Labs: Lab Results  Component Value Date   HGBA1C 6.4 (H) 01/12/2017   MPG 137 01/12/2017   Lab Results  Component Value Date   PROLACTIN 42.2 (H) 01/12/2017   Lab Results  Component Value Date   CHOL 258 (H) 01/12/2017   TRIG 190 (H) 01/12/2017   HDL 60 01/12/2017   CHOLHDL 4.3  01/12/2017   VLDL 38 01/12/2017   LDLCALC 160 (H) 01/12/2017    Physical Findings: AIMS: Facial and Oral Movements Muscles of Facial Expression: None, normal Lips and Perioral Area: None, normal Jaw: None, normal Tongue: None, normal,Extremity Movements Upper (arms, wrists, hands, fingers): None, normal Lower (legs, knees, ankles, toes): None, normal, Trunk Movements Neck, shoulders, hips: None, normal, Overall Severity Severity of abnormal movements (highest score from questions above): None, normal Incapacitation due to abnormal movements: None, normal Patient's awareness of abnormal movements (rate only patient's report): No Awareness, Dental Status Current problems with teeth and/or dentures?: No Does patient usually wear dentures?: No  CIWA:    COWS:     Musculoskeletal: Strength & Muscle Tone: within normal limits Gait & Station: normal Patient leans: N/A  Psychiatric  Specialty Exam: Physical Exam  Constitutional: He is oriented to person, place, and time. He is cooperative.  Respiratory: Effort normal and breath sounds normal.  Neurological: He is alert and oriented to person, place, and time.  Skin: Skin is warm and dry.  Psychiatric: His speech is normal and behavior is normal. His mood appears anxious. Thought content is not paranoid and not delusional. Cognition and memory are normal. He exhibits a depressed mood. He expresses no homicidal and no suicidal ideation. He expresses no suicidal plans.    Review of Systems  Constitutional: Negative.   Respiratory: Negative.   Cardiovascular: Negative.   Gastrointestinal: Negative.   Musculoskeletal: Negative.   Neurological: Negative for dizziness, focal weakness, seizures and headaches.  Psychiatric/Behavioral: Positive for depression. Negative for hallucinations. The patient is nervous/anxious. The patient does not have insomnia.     Blood pressure 116/81, pulse 70, temperature 97.6 F (36.4 C), temperature source  Oral, resp. rate 18, height 5\' 8"  (1.727 m), weight 77.1 kg (170 lb), SpO2 96 %.Body mass index is 25.85 kg/m.  General Appearance: Disheveled  Eye Contact:  Good  Speech:  Clear and Coherent  Volume:  Normal  Mood:  Dysphoric  Affect:  Blunt and Constricted  Thought Process:  Coherent, Linear and Descriptions of Associations: Intact  Orientation:  Full (Time, Place, and Person)  Thought Content:  Logical and Hallucinations: None  Suicidal Thoughts:  Passive suicidal ideations  Homicidal Thoughts:  No  Memory:  Immediate;   Good Recent;   Good Remote;   Good  Judgement:  Poor  Insight:  Shallow  Psychomotor Activity:  Normal  Concentration:  Concentration: Fair and Attention Span: Fair  Recall:  Good  Fund of Knowledge:  Good  Language:  Good  Akathisia:  No  Handed:    AIMS (if indicated):     Assets:  Communication Skills Desire for Improvement Transportation Vocational/Educational  ADL's:  Intact  Cognition:  WNL  Sleep:  Number of Hours: 7.3     Treatment Plan Summary: Patient is a 66 year old divorced Caucasian male with major depressive disorder, one prior suicidal attempt and very limited social support presents with worsening depression in the setting of severe financial distress and unemployment.  Major depressive disorder: still reports depressed mood, continue on Cymbalta 60 mg a day. Reports no medication side effects  Agitation:  Remains anxious and concerned about his money issues and living situations, continue risperdal 0.25mg  tid I will add Risperdal 0.25 mg 3 times a day to target agitation.   Insomnia:  Sleeping well, continued on trazodone 100 mg by mouth daily at bedtime  Labs: TSH and vitamin B12---wnl  Diet regular  Hospitalization and status voluntary  Precautions every 15 minute checks  Vital signs daily  Disposition was stable he will be discharged back to his home  Follow up patient will be schedule to f/u out with  psychiatry.   Pasty Spillers, [place] 04/11/2017, 1:44 PM

## 2017-04-12 NOTE — Plan of Care (Signed)
Problem: Activity: Goal: Sleeping patterns will improve Outcome: Progressing Patient slept for Estimated Hours of 7.15; q15 minutes safety round maintained, no injury or falls during this shift.

## 2017-04-12 NOTE — Progress Notes (Signed)
[facility] MD Progress Note  04/12/2017 11:08 AM Caleb Berg  MRN:  [id number] Subjective:  66 year old Caucasian male who presented to [facility] emergency department on June 22 with complaints of depression  Patient reported hyperphagia, feelings of guilt and low energy. He denied SI or HI. He also denied any psychotic symptoms. He denies having any history of symptoms consistent with mania or hypomania.  Patient is currently unemployed. He is on the verge of becoming homeless. His car was recently repossessed. He is unable to pay his bills. Patient was initially working 40 hours but his hours were decreased to 16 so he quit that job and has not been able to find another one.  Patient attempted suicide earlier this year by overdosing on sleeping medication. He was admitted to behavioral health in [place].  6/24 patient denies having suicidality. He still appears very distressed about his problems at home. His mood was somewhat irritable. He denies problems with sleep, appetite, energy or concentration. Denies hallucinations. Denies homicidal ideation or physical complaints. Denies side effects from medications.  Family was contacted by Education officer, museum. He reported the patient has a history of gambling and had debt even before he lost his job. They're willing to help with co-pays from appointments and medications.  6/25 patient appears still to be dysphoric and at times irritable. He tells me he continues to have passive suicidal ideation, he wishes he will go to sleep and never had to wake up. He denies any side effects from medications or having any physical complaints. He denies homicidal ideation or psychosis. He denies problems with appetite, energy, sleep or concentration. Still overwhelmed about his financial issues. The patient explains that he has already moved out of his apartment and for the last few days he had been staying in a motel. He had not told his family.  6/26 patient seems  less dysphoric and his affect is not as constricted. He has been participating actively in all groups. He is pleasant and cooperative with staff and peers. He denies side effects from medications or having any physical complaints. Says he has been eating well and is states his sleep is good. Denies SI, HI or hallucinations.  Per nursing: Patient is alert and oriented to person, place and time. Skin is warm, dry and intact. No limitations to all four extremities noted. Patient currently denies SI, AVH at this time. Medication is taken by patient without difficulty nor noted side affects. Patient was observed ambulating in hall during the shift with a steady gait. Attends meals and group with selective peer interaction noted. VS WNL, milieu remains therapeutic. Patient will be monitored and physician notified of any acute changes.   Principal Problem: MDD (major depressive disorder), recurrent severe, without psychosis ([place]) Diagnosis:   Patient Active Problem List   Diagnosis Date Noted  . MDD (major depressive disorder), recurrent severe, without psychosis ([place]) [F33.2] 04/08/2017   Total Time spent with patient: 30 minutes  Past Psychiatric History: Prior diagnosis of major depressive disorder given at behavioral health in [place] 3 months ago. He was admitted there after an overdose. He was restarted on Cymbalta and trazodone. He had been following up outpatient but 2 weeks ago he stopped his medications because he was unable to afford them.  Past Medical History:  Past Medical History:  Diagnosis Date  . Depression     Past Surgical History:  Procedure Laterality Date  . HERNIA REPAIR     Family History: History reviewed. No pertinent family  history.   Family Psychiatric  History: Father suffered from depression. He also has a younger brother  And sister who were  diagnosed with depression  Social History:  Patient is divorced. He has one daughter who is 89 years old. No very  close to her only occasional communication. Lives alone. He has 2 years of college education. 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 Denies having any legal history.  History  Alcohol Use No     History  Drug Use No    Social History   Social History  . Marital status: Married    Spouse name: N/A  . Number of children: N/A  . Years of education: N/A   Social History Main Topics  . Smoking status: Never Smoker  . Smokeless tobacco: Never Used  . Alcohol use No  . Drug use: No  . Sexual activity: Not Currently    Birth control/ protection: Abstinence   Other Topics Concern  . None   Social History Narrative  . None     Current Medications: Current Facility-Administered Medications  Medication Dose Route Frequency Provider Last Rate Last Dose  . acetaminophen (TYLENOL) tablet 650 mg  650 mg Oral Q6H PRN Ethelene Hal, NP      . alum & mag hydroxide-simeth (MAALOX/MYLANTA) 200-200-20 MG/5ML suspension 30 mL  30 mL Oral Q4H PRN Ethelene Hal, NP      . DULoxetine (CYMBALTA) DR capsule 60 mg  60 mg Oral Daily Ethelene Hal, NP   60 mg at 04/12/17 0815  . magnesium hydroxide (MILK OF MAGNESIA) suspension 30 mL  30 mL Oral Daily PRN Ethelene Hal, NP      . risperiDONE (RISPERDAL) tablet 0.25 mg  0.25 mg Oral TID Hildred Priest, MD   0.25 mg at 04/12/17 0815  . traZODone (DESYREL) tablet 100 mg  100 mg Oral QHS Hildred Priest, MD   100 mg at 04/11/17 2116    Lab Results:  No results found for this or any previous visit (from the past 48 hour(s)).  Blood Alcohol level:  Lab Results  Component Value Date   ETH <5 04/07/2017   ETH <5 29/51/8841    Metabolic Disorder Labs: Lab Results  Component Value Date   HGBA1C 6.4 (H) 01/12/2017   MPG 137 01/12/2017   Lab Results  Component Value Date   PROLACTIN 42.2 (H) 01/12/2017   Lab Results  Component Value Date    CHOL 258 (H) 01/12/2017   TRIG 190 (H) 01/12/2017   HDL 60 01/12/2017   CHOLHDL 4.3 01/12/2017   VLDL 38 01/12/2017   LDLCALC 160 (H) 01/12/2017    Physical Findings: AIMS: Facial and Oral Movements Muscles of Facial Expression: None, normal Lips and Perioral Area: None, normal Jaw: None, normal Tongue: None, normal,Extremity Movements Upper (arms, wrists, hands, fingers): None, normal Lower (legs, knees, ankles, toes): None, normal, Trunk Movements Neck, shoulders, hips: None, normal, Overall Severity Severity of abnormal movements (highest score from questions above): None, normal Incapacitation due to abnormal movements: None, normal Patient's awareness of abnormal movements (rate only patient's report): No Awareness, Dental Status Current problems with teeth and/or dentures?: No Does patient usually wear dentures?: No  CIWA:    COWS:     Musculoskeletal: Strength & Muscle Tone: within normal limits Gait & Station: normal Patient leans: N/A  Psychiatric Specialty Exam: Physical Exam  Constitutional: He is oriented to person, place, and time.  He appears well-developed and well-nourished.  HENT:  Head: Normocephalic and atraumatic.  Eyes: Conjunctivae and EOM are normal.  Neck: Normal range of motion.  Respiratory: Effort normal.  Musculoskeletal: Normal range of motion.  Neurological: He is alert and oriented to person, place, and time.    Review of Systems  Constitutional: Negative.   HENT: Negative.   Eyes: Negative.   Respiratory: Negative.   Cardiovascular: Negative.   Gastrointestinal: Negative.   Genitourinary: Negative.   Musculoskeletal: Negative.   Skin: Negative.   Neurological: Negative.   Endo/Heme/Allergies: Negative.   Psychiatric/Behavioral: Positive for depression. Negative for suicidal ideas.    Blood pressure 132/76, pulse 83, temperature 98 F (36.7 C), temperature source Oral, resp. rate 17, height 5\' 8"  (1.727 m), weight 77.1 kg (170  lb), SpO2 99 %.Body mass index is 25.85 kg/m.  General Appearance: Well Groomed  Eye Contact:  Good  Speech:  Clear and Coherent  Volume:  Normal  Mood:  Dysphoric  Affect:  Appropriate and Congruent  Thought Process:  Linear and Descriptions of Associations: Intact  Orientation:  Full (Time, Place, and Person)  Thought Content:  Hallucinations: None  Suicidal Thoughts:  No  Homicidal Thoughts:  No  Memory:  Immediate;   Good Recent;   Good Remote;   Good  Judgement:  Poor  Insight:  Shallow  Psychomotor Activity:  Decreased  Concentration:  Concentration: Fair and Attention Span: Fair  Recall:  Good  Fund of Knowledge:  Good  Language:  Good  Akathisia:  No  Handed:    AIMS (if indicated):     Assets:  Communication Skills  ADL's:  Intact  Cognition:  WNL  Sleep:  Number of Hours: 7.15     Treatment Plan Summary:  Patient is a 66 year old divorced Caucasian male with major depressive disorder, one prior suicidal attempt and very limited social support presents with worsening depression in the setting of severe financial distress and unemployment.  Major depressive disorder: Patient requests to be continued on Cymbalta 60 mg a day. Has been attending all groups  Agitation: Continue Risperdal 0.25 mg 3 times a day for now  Insomnia:  continued on trazodone 100 mg by mouth daily at bedtime--- no issues with sleep  Labs: TSH and vitamin B12---wnl  Diet regular  Hospitalization and status voluntary  Precautions every 15 minute checks  Vital signs daily  Disposition: he will be discharged back to a hotel. His family has agreed to pay for a month  Follow up patient will be schedule to f/u out with psychiatry.  Plan to discharge in the next 48 hours.   Hildred Priest, MD 04/12/2017, 11:08 AM

## 2017-04-12 NOTE — Progress Notes (Signed)
Patient ID: Caleb Berg, male   DOB: 09/14/1951, 66 y.o.   MRN: [id number] Pleasant on approach, A&Ox3, denied pain, observed in room at the start of the shift reading a Novel in his room; well groomed, mood and affect appropriate, still preoccupied about the financial hardship, denied SI/SIB/HI/AVH; medication compliant.

## 2017-04-12 NOTE — Progress Notes (Signed)
Patient is alert and oriented to person, place and time. Skin is warm, dry and intact. No limitations to all four extremities noted. Patient currently denies SI, AVH at this time. Medication is taken by patient without difficulty nor noted side affects. Patient was observed ambulating in hall during the shift with a steady gait. Attends meals and group with selective peer interaction noted. VS WNL, milieu remains therapeutic. Patient will be monitored and physician notified of any acute changes.

## 2017-04-12 NOTE — BHH Group Notes (Signed)
[facility] LCSW Group Therapy Note  Date/Time: 04/12/17, 9:30am  Type of Therapy/Topic:  Group Therapy:  Feelings about Diagnosis  Participation Level:  Active   Mood: Okay   Description of Group:    This group will allow patients to explore their thoughts and feelings about diagnoses they have received. Patients will be guided to explore their level of understanding and acceptance of these diagnoses. Facilitator will encourage patients to process their thoughts and feelings about the reactions of others to their diagnosis, and will guide patients in identifying ways to discuss their diagnosis with significant others in their lives. This group will be process-oriented, with patients participating in exploration of their own experiences as well as giving and receiving support and challenge from other group members.   Therapeutic Goals: 1. Patient will demonstrate understanding of diagnosis as evidence by identifying two or more symptoms of the disorder:  2. Patient will be able to express two feelings regarding the diagnosis 3. Patient will demonstrate ability to communicate their needs through discussion and/or role plays  Summary of Patient Progress:  Pt able to meet above therapeutic goals.  Cheerful affect, making a few jokes.  Interacted appropriately with others.   Therapeutic Modalities:   Cognitive Behavioral Therapy Brief Therapy Feelings Identification   August Saucer, LCSW 04/12/2017, 5:03 PM

## 2017-04-12 NOTE — Plan of Care (Signed)
Problem: [facility] Participation in Recreation Therapeutic Interventions Goal: STG-Patient will demonstrate improved self esteem by identif STG: Self-Esteem - Within 4 treatment sessions, patient will verbalize at least 5 positive affirmation statements in each of 2 treatment sessions to increase self-esteem.  Outcome: Progressing Treatment Session 1; Completed 1 out of 2: At approximately 2:00 pm, LRT met with patient in patient room. Patient verbalized 5 positive affirmation statements. Patient reported it felt "pretty good". LRT encouraged patient to continue saying positive affirmation statements.   Leonette Monarch, LRT/CTRS 06.26.18 4:50 pm Goal: STG-Other Recreation Therapy Goal (Specify) STG: Stress Management - Within 4 treatment sessions, patient will verbalize understanding of the stress management techniques in each of 2 treatment sessions to increase stress management skills.  Outcome: Progressing Treatment Session 1; Completed 1 out of 2: At approximately 2:00 pm, LRT met with patient in patient room. LRT educated and provided patient with handouts on stress management techniques. Patient verbalized understanding. LRT encouraged patient to read over and practice the stress management techniques.  Leonette Monarch, LRT/CTRS 06.26.18 4:52 pm

## 2017-04-12 NOTE — Plan of Care (Signed)
Problem: Safety: Goal: Ability to remain free from injury will improve Outcome: Progressing Patient remains free from injury while on the unit.

## 2017-04-12 NOTE — Progress Notes (Signed)
Recreation Therapy Notes  Date: 06.26.18 Time: 3:00 pm Location: Craft Room  Group Topic: Self-expression  Goal Area(s) Addresses:  Patient will be able to identify a color that represents each emotion. Patient will verbalize benefit of using art as a means of self-expression. Patient will verbalize one emotion experienced while participating in activity.  Behavioral Response: Attentive, Interactive  Intervention: The Colors Within Me  Activity: Patients were given a blank face worksheet and were instructed to pick a color for each emotion they were feeling and show on the worksheet how much of that emotion they were feeling.  Education: LRT educated patients on other forms of self-expression.  Education Outcome: Acknowledges education/In group clarification offered  Clinical Observations/Feedback: Patient picked a color for each emotion he was feeling and showed on the worksheet how much of that emotion he was feeling. Patient contributed to group discussion by stating what emotions he was feeling, and how his emotions affect his treatment in the hospital.  Leonette Monarch, LRT/CTRS 04/12/2017 3:57 PM

## 2017-04-12 NOTE — BHH Group Notes (Signed)
Goals Group Date/Time: 04/12/2017 9:00 AM Type of Therapy and Topic: Group Therapy: Goals Group: SMART Goals   Participation Level: Moderate  Description of Group:    The purpose of a daily goals group is to assist and guide patients in setting recovery/wellness-related goals. The objective is to set goals as they relate to the crisis in which they were admitted. Patients will be using SMART goal modalities to set measurable goals. Characteristics of realistic goals will be discussed and patients will be assisted in setting and processing how one will reach their goal. Facilitator will also assist patients in applying interventions and coping skills learned in psycho-education groups to the SMART goal and process how one will achieve defined goal.   Therapeutic Goals:   -Patients will develop and document one goal related to or their crisis in which brought them into treatment.  -Patients will be guided by LCSW using SMART goal setting modality in how to set a measurable, attainable, realistic and time sensitive goal.  -Patients will process barriers in reaching goal.  -Patients will process interventions in how to overcome and successful in reaching goal.   Patient's Goal: work on discharge plan   Therapeutic Modalities:  Motivational Interviewing  Public relations account executive Therapy  Crisis Intervention Model  SMART goals setting    August Saucer, LCSW 04/12/2017, 5:03 PM

## 2017-04-13 DIAGNOSIS — F319 Bipolar disorder, unspecified: Secondary | ICD-10-CM

## 2017-04-13 DIAGNOSIS — G3184 Mild cognitive impairment, so stated: Secondary | ICD-10-CM

## 2017-04-13 MED ORDER — TRAZODONE HCL 100 MG PO TABS: 100.0000 mg | ORAL_TABLET | Freq: Every day | ORAL | 0 refills | Status: AC

## 2017-04-13 MED ORDER — RISPERIDONE 1 MG PO TABS
1.0000 mg | ORAL_TABLET | Freq: Every day | ORAL | Status: DC
  Administered 2017-04-14: 1 mg via ORAL
  Filled 2017-04-13: qty 1

## 2017-04-13 MED ORDER — DULOXETINE HCL 60 MG PO CPEP: 60.0000 mg | ORAL_CAPSULE | Freq: Every day | ORAL | 0 refills | Status: AC

## 2017-04-13 MED ORDER — RISPERIDONE 1 MG PO TABS: 1.0000 mg | ORAL_TABLET | Freq: Every day | ORAL | 0 refills | Status: DC

## 2017-04-13 NOTE — Progress Notes (Signed)
Patient ID: YU PEGGS, male   DOB: March 22, 1951, 66 y.o.   MRN: [id number] Improved mood and affect, appropriate, visible, interacting well with peers, denied pain, denied SI/HI/AVH, medication compliant, anticipating discharge.

## 2017-04-13 NOTE — Progress Notes (Signed)
Recreation Therapy Notes  Date: 06.27.18 Time: 1:00 pm Location: Craft Room  Group Topic: Self-esteem  Goal Area(s) Addresses:  Patient will be able to identify benefit of self-esteem. Patient will be able to identify ways to increase self-esteem.  Behavioral Response: Attentive, Interactive  Intervention: Self-Portrait  Activity: Patients were given a blank face worksheet and were instructed to draw a self-portrait of how they were feeling. Patients were given construction paper and were instructed to put their first name on it and one positive trait about themselves. Patients passed the papers around the room and wrote positive traits about peers. Patients were given blank face worksheets again to draw their self-portraits of how they felt after reading positive comments from peers.  Education: LRT educated patient on ways to increase their self-esteem.  Education Outcome: Acknowledges education/In group clarification offered  Clinical Observations/Feedback: Patient drew both self-portraits. Patient wrote positive traits about self and peer. Patient contributed to group discussion by stating how his self-esteem affects him and how he can increase his self-esteem.  Leonette Monarch, LRT/CTRS 04/13/2017 1:43 PM

## 2017-04-13 NOTE — Plan of Care (Signed)
Problem: Safety: Goal: Ability to remain free from injury will improve Outcome: Progressing Gait normal , able to alert staff of  Problems staff visiblely   continue to do safety round.

## 2017-04-13 NOTE — Plan of Care (Signed)
Problem: Education: Goal: Knowledge of [facility] Education information/materials will improve Outcome: Progressing Patient voice  Understanding   With the education instructed on unit programing

## 2017-04-13 NOTE — Progress Notes (Signed)
D: Affect cheerful on approach  Interacting  With peers and staff. Able to apply himself with unit  Programing Patient stated slept good last night .Stated appetite is good and energy level  Is normal. Stated concentration is good . Stated on Depression scale 6, hopeless 6 and anxiety 3 .( low 0-10 high) Denies suicidal  homicidal ideations  .  No auditory hallucinations  No pain concerns . Appropriate ADL'S. Interacting with peers and staff. Working on Radiographer, therapeutic . Listings of coping skills given  To patient  A: Encourage patient participation with unit programming . Instruction  Given on  Medication , verbalize understanding. R: Voice no other concerns. Staff continue to monitor

## 2017-04-13 NOTE — Plan of Care (Signed)
Problem: Activity: Goal: Sleeping patterns will improve Outcome: Progressing Patient slept for Estimated Hours of 7.30; q15 minutes safety round maintained, no injury or falls during this shift.

## 2017-04-13 NOTE — Plan of Care (Signed)
Problem: [facility] Participation in Recreation Therapeutic Interventions Goal: STG-Patient will demonstrate improved self esteem by identif STG: Self-Esteem - Within 4 treatment sessions, patient will verbalize at least 5 positive affirmation statements in each of 2 treatment sessions to increase self-esteem.  Outcome: Completed/Met Date Met: 04/13/17 Treatment Session 2; Completed 2 out of 2: At approximately 9:05 am, LRT met with patient in consultation room. Patient verbalized 5 positive affirmation statements. Patient reported it felt "good". LRT encouraged patient to continue saying positive affirmation statements.  Leonette Monarch, LRT/CTRS 06.27.18 3:35 pm Goal: STG-Other Recreation Therapy Goal (Specify) STG: Stress Management - Within 4 treatment sessions, patient will verbalize understanding of the stress management techniques in each of 2 treatment sessions to increase stress management skills.  Outcome: Completed/Met Date Met: 04/13/17 Treatment Session 2; Completed 2 out of 2: At approximately 9:05 am, LRT met with patient in consultation room. Patient verbalized understanding of the stress management techniques. LRT encouraged patient to use the techniques.  Leonette Monarch, LRT/CTRS 06.27.18 3:36 pm

## 2017-04-13 NOTE — BHH Suicide Risk Assessment (Addendum)
[facility] Discharge Suicide Risk Assessment   Principal Problem: Bipolar disorder [facility]) Discharge Diagnoses:  Patient Active Problem List   Diagnosis Date Noted  . Bipolar disorder ([place]) [F31.9] 04/13/2017  . 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 rate 17, height 5\' 8"  (1.727 m), weight 77.1 kg (170 lb), SpO2 99 %.Body mass index is 25.85 kg/m.                                                       Mental Status Per Nursing Assessment::   On Admission:     Demographic Factors:  Male, Divorced or widowed, Caucasian, Low socioeconomic status, Living alone and Unemployed  Loss Factors: Financial problems/change in socioeconomic status  Historical Factors: Prior suicide attempts and Family history of mental illness or substance abuse  Risk Reduction Factors:   Sense of responsibility to family and Positive social support  No access to guns  Continued Clinical Symptoms:  Bipolar Disorder:   Depressive phase Previous Psychiatric Diagnoses and Treatments  Cognitive Features That Contribute To Risk:  Loss of executive function    Suicide Risk:  Minimal: No identifiable suicidal ideation.  Patients presenting with no risk factors but with morbid ruminations; may be classified as minimal risk based on the severity of the depressive symptoms     Hildred Priest, MD 04/13/2017, 12:43 PM

## 2017-04-13 NOTE — Progress Notes (Signed)
[facility] MD Progress Note  04/13/2017 12:22 PM Caleb Berg  MRN:  [id number] Subjective:  66 year old Caucasian male who presented to [facility] emergency department on June 22 with complaints of depression  Patient reported hyperphagia, feelings of guilt and low energy. He denied SI or HI. He also denied any psychotic symptoms. He denies having any history of symptoms consistent with mania or hypomania.  Patient is currently unemployed. He is on the verge of becoming homeless. His car was recently repossessed. He is unable to pay his bills. Patient was initially working 40 hours but his hours were decreased to 16 so he quit that job and has not been able to find another one.  Patient attempted suicide earlier this year by overdosing on sleeping medication. He was admitted to behavioral health in [place].  6/24 patient denies having suicidality. He still appears very distressed about his problems at home. His mood was somewhat irritable. He denies problems with sleep, appetite, energy or concentration. Denies hallucinations. Denies homicidal ideation or physical complaints. Denies side effects from medications.  Family was contacted by Education officer, [employer]. He reported the patient has a history of gambling and had debt even before he lost his job. They're willing to help with co-pays from appointments and medications.  6/25 patient appears still to be dysphoric and at times irritable. He tells me he continues to have passive suicidal ideation, he wishes he will go to sleep and never had to wake up. He denies any side effects from medications or having any physical complaints. He denies homicidal ideation or psychosis. He denies problems with appetite, energy, sleep or concentration. Still overwhelmed about his financial issues. The patient explains that he has already moved out of his apartment and for the last few days he had been staying in a motel. He had not told his family.  6/26 patient seems  less dysphoric and his affect is not as constricted. He has been participating actively in all groups. He is pleasant and cooperative with staff and peers. He denies side effects from medications or having any physical complaints. Says he has been eating well and is states his sleep is good. Denies SI, HI or hallucinations.  6/27 patient denies any problems with sleep, appetite, energy or concentration. He says he still feels depressed but feels less anxious and less helpless than at admission. He is tolerating medication well. Denies any side effects. Denies suicidality, homicidality or psychosis.  I spoke with the patient's brother yesterday who had concerns about possible bipolar disorder, cognitive issues and gambling addiction.  Brother stated that sometimes his brother would not call and be depressed and at the next day he'll be almost euphoric. They also have observed significant forgetfulness. He believes that the patient divorced because of gambling problems. A significant history of bipolar disorder as there are 2 siblings that have been diagnosed with and one of them has been hospitalized. They also have a brother with dementia.  We completed a MOCA screening history score was 22 which is in the range of mild cognitive disorder.  He denies all symptoms consistent with mania or hypomania. He also denies any history or issues with gambling since he has been in [place].  Per nursing: Patient slept for Estimated Hours of 7.30; q15 minutes safety round maintained, no injury or falls during this shift.  Improved mood and affect, appropriate, visible, interacting well with peers, denied pain, denied SI/HI/AVH, medication compliant, anticipating discharge.  Principal Problem: MDD (major depressive disorder), recurrent  severe, without psychosis ([place]) Diagnosis:   Patient Active Problem List   Diagnosis Date Noted  . MDD (major depressive disorder), recurrent severe, without psychosis  ([place]) [F33.2] 04/08/2017   Total Time spent with patient: 30 minutes  Past Psychiatric History: Prior diagnosis of major depressive disorder given at behavioral health in [place] 3 months ago. He was admitted there after an overdose. He was restarted on Cymbalta and trazodone. He had been following up outpatient but 2 weeks ago he stopped his medications because he was unable to afford them.  Past Medical History:  Past Medical History:  Diagnosis Date  . Depression     Past Surgical History:  Procedure Laterality Date  . HERNIA REPAIR     Family History: History reviewed. No pertinent family history.   Family Psychiatric  History: Father suffered from depression. He also has a younger brother  And sister who were  diagnosed with depression  Social History:  Patient is divorced. He has one daughter who is 78 years old. No very close to her only occasional communication. Lives alone. He has 2 years of college education. 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 Denies having any legal history.  History  Alcohol Use No     History  Drug Use No    Social History   Social History  . Marital status: Married    Spouse name: N/A  . Number of children: N/A  . Years of education: N/A   Social History Main Topics  . Smoking status: Never Smoker  . Smokeless tobacco: Never Used  . Alcohol use No  . Drug use: No  . Sexual activity: Not Currently    Birth control/ protection: Abstinence   Other Topics Concern  . None   Social History Narrative  . None     Current Medications: Current Facility-Administered Medications  Medication Dose Route Frequency Provider Last Rate Last Dose  . acetaminophen (TYLENOL) tablet 650 mg  650 mg Oral Q6H PRN Ethelene Hal, NP      . alum & mag hydroxide-simeth (MAALOX/MYLANTA) 200-200-20 MG/5ML suspension 30 mL  30 mL Oral Q4H PRN Ethelene Hal, NP      . DULoxetine  (CYMBALTA) DR capsule 60 mg  60 mg Oral Daily Ethelene Hal, NP   60 mg at 04/13/17 0849  . magnesium hydroxide (MILK OF MAGNESIA) suspension 30 mL  30 mL Oral Daily PRN Ethelene Hal, NP      . risperiDONE (RISPERDAL) tablet 0.25 mg  0.25 mg Oral TID Hildred Priest, MD   0.25 mg at 04/13/17 0849  . traZODone (DESYREL) tablet 100 mg  100 mg Oral QHS Hildred Priest, MD   100 mg at 04/12/17 2116    Lab Results:  No results found for this or any previous visit (from the past 48 hour(s)).  Blood Alcohol level:  Lab Results  Component Value Date   ETH <5 04/07/2017   ETH <5 44/12/4740    Metabolic Disorder Labs: Lab Results  Component Value Date   HGBA1C 6.4 (H) 01/12/2017   MPG 137 01/12/2017   Lab Results  Component Value Date   PROLACTIN 42.2 (H) 01/12/2017   Lab Results  Component Value Date   CHOL 258 (H) 01/12/2017   TRIG 190 (H) 01/12/2017   HDL 60 01/12/2017   CHOLHDL 4.3 01/12/2017   VLDL 38 01/12/2017   LDLCALC 160 (H) 01/12/2017    Physical Findings:  AIMS: Facial and Oral Movements Muscles of Facial Expression: None, normal Lips and Perioral Area: None, normal Jaw: None, normal Tongue: None, normal,Extremity Movements Upper (arms, wrists, hands, fingers): None, normal Lower (legs, knees, ankles, toes): None, normal, Trunk Movements Neck, shoulders, hips: None, normal, Overall Severity Severity of abnormal movements (highest score from questions above): None, normal Incapacitation due to abnormal movements: None, normal Patient's awareness of abnormal movements (rate only patient's report): No Awareness, Dental Status Current problems with teeth and/or dentures?: No Does patient usually wear dentures?: No  CIWA:    COWS:     Musculoskeletal: Strength & Muscle Tone: within normal limits Gait & Station: normal Patient leans: N/A  Psychiatric Specialty Exam: Physical Exam  Constitutional: He is oriented to person,  place, and time. He appears well-developed and well-nourished.  HENT:  Head: Normocephalic and atraumatic.  Eyes: Conjunctivae and EOM are normal.  Neck: Normal range of motion.  Respiratory: Effort normal.  Musculoskeletal: Normal range of motion.  Neurological: He is alert and oriented to person, place, and time.    Review of Systems  Constitutional: Negative.   HENT: Negative.   Eyes: Negative.   Respiratory: Negative.   Cardiovascular: Negative.   Gastrointestinal: Negative.   Genitourinary: Negative.   Musculoskeletal: Negative.   Skin: Negative.   Neurological: Negative.   Endo/Heme/Allergies: Negative.   Psychiatric/Behavioral: Positive for depression. Negative for suicidal ideas.    Blood pressure 116/81, pulse 92, temperature 98.2 F (36.8 C), resp. rate 17, height 5\' 8"  (1.727 m), weight 77.1 kg (170 lb), SpO2 99 %.Body mass index is 25.85 kg/m.  General Appearance: Well Groomed  Eye Contact:  Good  Speech:  Clear and Coherent  Volume:  Normal  Mood:  Dysphoric  Affect:  Appropriate and Congruent  Thought Process:  Linear and Descriptions of Associations: Intact  Orientation:  Full (Time, Place, and Person)  Thought Content:  Hallucinations: None  Suicidal Thoughts:  No  Homicidal Thoughts:  No  Memory:  Immediate;   Good Recent;   Good Remote;   Good  Judgement:  Poor  Insight:  Shallow  Psychomotor Activity:  Decreased  Concentration:  Concentration: Fair and Attention Span: Fair  Recall:  Good  Fund of Knowledge:  Good  Language:  Good  Akathisia:  No  Handed:    AIMS (if indicated):     Assets:  Communication Skills  ADL's:  Intact  Cognition:  WNL  Sleep:  Number of Hours: 7.3     Treatment Plan Summary:  Patient is a 66 year old divorced Caucasian male with major depressive disorder, one prior suicidal attempt and very limited social support presents with worsening depression in the setting of severe financial distress and  unemployment.  Major depressive disorder: Patient requests to be continued on Cymbalta 60 mg a day. Has been attending all groups  Possible bipolar disorder based on the collateral information in his family history of having 2 siblings with the disorder. 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 Potentially the cognitive issues could be secondary to depression. However I recommend to retest in 6 months-12 m as this could be the beginnings of a more serious neurocognitive disorder.  Labs: TSH and vitamin B12---wnl  Diet regular  Hospitalization and status voluntary  Precautions every 15 minute checks  Vital signs daily  Disposition: Unfortunately  the patient has nowhere to go. The family initially stated that they will help with the cost of a hotel. They unfortunately call us back yesterday and stated that they will not do it.  He will have to be discharged to the shelter  Follow up patient will be schedule to f/u out with psychiatry.  Plan to discharge likely tomorrow   Hildred Priest, MD 04/13/2017, 12:22 PM

## 2017-04-13 NOTE — [facility] Group Notes (Signed)
[facility] LCSW Group Therapy  04/13/2017 10:47 AM  Type of Therapy:  Group Therapy  Participation Level:  Patient did not attend group. CSW invited patient to group.   Summary of Progress/Problems: Emotional Regulation: Patients will identify both negative and positive emotions. They will discuss emotions they have difficulty regulating and how they impact their lives. Patients will be asked to identify healthy coping skills to combat unhealthy reactions to negative emotions.    Jaramiah Bossard G. [place], [place] 04/13/2017, 10:48 AM

## 2017-04-14 NOTE — [facility] Group Notes (Signed)
[facility] LCSW Group Therapy  04/14/2017 10:31 AM  Type of Therapy:  Group Therapy  Participation Level:  Active  Participation Quality:  Attentive  Affect:  Appropriate  Cognitive:  Alert  Insight:  Limited  Engagement in Therapy:  Improving  Modes of Intervention:  Activity, Discussion, Education, Problem-solving, Reality Testing, Socialization and Support  Summary of Progress/Problems: Balance in life: Patients will discuss the concept of balance and how it looks and feels to be unbalanced. Pt will identify areas in their life that is unbalanced and ways to become more balanced. They discussed what aspects in their lives has influenced their self care. Patients also discussed self care in the areas of self regulation/control, hygiene/appearance, sleep/relaxation, healthy leisure, healthy eating habits, exercise, inner peace/spirituality, self improvement, sobriety, and health management. They were challenged to identify changes that are needed in order to improve self care.  Enos Muhl G. [place], [place] 04/14/2017, 10:31 AM

## 2017-04-14 NOTE — Plan of Care (Signed)
Problem: Safety: Goal: Ability to remain free from injury will improve Outcome: Progressing No injury. Safety precautions maintained   

## 2017-04-14 NOTE — Plan of Care (Signed)
Problem: Health Behavior/Discharge Planning: Goal: Ability to manage health-related needs will improve Outcome: Progressing Pt compliant with treatment and expressing  Readiness for discharge.

## 2017-04-14 NOTE — Discharge Summary (Addendum)
Physician Discharge Summary Note  Patient:  Caleb Berg is an 66 y.o., male MRN:  [id number] DOB:  08-15-1951 Patient phone:  [phone] (home)  Patient address:   [address] [facility] [address] [place]  [id number],  Total Time spent with patient: 30 minutes  Date of Admission:  04/08/2017 Date of Discharge: 04/15/17  Reason for Admission:  SI  Principal Problem: Bipolar disorder [facility]) Discharge Diagnoses: Patient Active Problem List   Diagnosis Date Noted  . Prediabetes [R73.03] 04/15/2017  . Bipolar disorder ([place]) [F31.9] 04/13/2017  . 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 He denied SI or HI. He also denied any psychotic symptoms. He denies having any history of symptoms consistent with mania or hypomania.  Patient is currently unemployed. He is on the verge of becoming homeless. His car was recently repossessed. He is unable to pay his bills. Patient was initially working 40 hours but his hours were decreased to 16 so he quit that job and has not been able to find another one.  Patient attempted suicide earlier this year by overdosing on sleeping medication. He was admitted to behavioral health in [place].  Trauma negative  Substance abuse: Patient started drinking 5 years ago. 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 He denies the use of any illicit substances or abusing alcohol.  Denies cigarette use  Denies access to guns.   Associated Signs/Symptoms: Depression Symptoms:  depressed mood, hypersomnia, hopelessness, (Hypo) Manic Symptoms:  denies Anxiety Symptoms:  Excessive Worry, Psychotic Symptoms:  denies PTSD Symptoms: NA Total Time spent with patient: 1 hour  Past Psychiatric History: Prior  diagnosis of major depressive disorder given at behavioral health in [place] 3 months ago. He was admitted there after an overdose. He was restarted on Cymbalta and trazodone. He had been following up outpatient but 2 weeks ago he stopped his medications because he was unable to afford them.  Past Medical History:  Past Medical History:  Diagnosis Date  . Depression     Past Surgical History:  Procedure Laterality Date  . HERNIA REPAIR     Family History: History reviewed. No pertinent family history.  Family Psychiatric  History: Has a sister and a brother who have been diagnosed with bipolar disorder. He has one brother who has been diagnosed with dementia. No family history of suicide  Social History:  History  Alcohol Use No     History  Drug Use No    Social History   Social History  . Marital status: Married    Spouse name: N/A  . Number of children: N/A  . Years of education: N/A   Social History Main Topics  . Smoking status: Never Smoker  . Smokeless tobacco: Never Used  . Alcohol use No  . Drug use: No  . Sexual activity: Not Currently    Birth control/ protection: Abstinence   Other Topics Concern  . None   Social History Narrative  . None    Hospital Course:     Patient is a 66 year old divorced Caucasian male with major depressive disorder, one prior suicidal attempt and very limited social support presents with worsening depression in the setting of severe financial distress and unemployment.  Bipolar d/o: Possible bipolar disorder based on the collateral information in his  family history of having 2 siblings with the disorder. We will discharge the patient on Risperdal 2 mg by mouth daily at bedtime. Also continue Cymbalta 60 mg a day.   Insomnia:  continued on trazodone 100 mg by mouth daily at bedtime--- no issues with sleep  Mild neurocognitive disorder: MOCA of 21. Potentially the cognitive issues could be secondary to depression.  However I recommend to retest in 6 months-12 m as this could be the beginnings of a more serious neurocognitive disorder.  Pre diabetes: Hemoglobin A1c checked in April was 6.4. Patient has been educated about this diagnosis and need for diet/weight loss  Labs: TSH and vitamin B12---wnl  Disposition: Unfortunately the patient has nowhere to go. The family initially stated that they will help with the cost of a hotel but later changed their mind. They will pick him up and take him to look for beds at local shelters.  SW has not found any beds at local shelters.  Brother says he would come and pick him out today.  Follow up patient will be schedule to f/u out with psychiatry.  Plan to discharge today  Patient mood today was euthymic his affect was bright and reactive. He was walking around talking to patients and making jokes when meeting with [place]. This was a significant contrast from his presentation on previous days when he was very withdrawn and only provided short answers.   Family and the patient both confirm patient does not have any access to guns.  Patient does not have a place to go. No beds were found at local shelters. Brother initially stated family was contacted help to pay the cost of a hotel but eventually decided not to. The brother said that he will pick up the patient today from the hospital and driving around to the local shelters to see if there is any beds availability.  This hospitalization was on uneventful. The patient did not display any disruptive or aggressive behavior. He participated actively in programming.  Currently his acute risk for suicide is low as he is depression has improved, his is sleeping well, he is on antidepressants, he is denying suicidality, he is not psychotic, he is not under the influence of any substances. He is chronic risk is moderate to high to do his financial problems, current living situation, race, age and history of prior suicidal  attempt.  Patient has a supportive family and that is involving his care. There have been no concerns voiced about his safety today from our staff or family.   Physical Findings: AIMS: Facial and Oral Movements Muscles of Facial Expression: None, normal Lips and Perioral Area: None, normal Jaw: None, normal Tongue: None, normal,Extremity Movements Upper (arms, wrists, hands, fingers): None, normal Lower (legs, knees, ankles, toes): None, normal, Trunk Movements Neck, shoulders, hips: None, normal, Overall Severity Severity of abnormal movements (highest score from questions above): None, normal Incapacitation due to abnormal movements: None, normal Patient's awareness of abnormal movements (rate only patient's report): No Awareness, Dental Status Current problems with teeth and/or dentures?: No Does patient usually wear dentures?: No  CIWA:    COWS:     Musculoskeletal: Strength & Muscle Tone: within normal limits Gait & Station: normal Patient leans: N/A  Psychiatric Specialty Exam: Physical Exam  Constitutional: He is oriented to person, place, and time. He appears well-developed and well-nourished.  HENT:  Head: Normocephalic and atraumatic.  Eyes: Conjunctivae and EOM are normal.  Neck: Normal range of motion.  Respiratory: Effort normal.  Musculoskeletal: Normal range of motion.  Neurological: He is alert and oriented to person, place, and time.    Review of Systems  Constitutional: Negative.   HENT: Negative.   Eyes: Negative.   Respiratory: Negative.   Cardiovascular: Negative.   Gastrointestinal: Negative.   Genitourinary: Negative.   Musculoskeletal: Negative.   Skin: Negative.   Neurological: Negative.   Endo/Heme/Allergies: Negative.   Psychiatric/Behavioral: Positive for depression. Negative for hallucinations, memory loss, substance abuse and suicidal ideas. The patient is not nervous/anxious and does not have insomnia.     Blood pressure 117/81, pulse  75, temperature 97.8 F (36.6 C), temperature source Oral, resp. rate 18, height _0  (1.727 m), weight 77.1 kg (170 lb), SpO2 97 %.Body mass index is 25.85 kg/m.  General Appearance: Well Groomed  Eye Contact:  Good  Speech:  Clear and Coherent  Volume:  Normal  Mood:  Dysphoric mild  Affect:  Appropriate and Congruent  Thought Process:  Linear and Descriptions of Associations: Intact  Orientation:  Full (Time, Place, and Person)  Thought Content:  Hallucinations: None  Suicidal Thoughts:  No  Homicidal Thoughts:  No  Memory:  Immediate;   Fair Recent;   Fair Remote;   Good  Judgement:  Fair  Insight:  Fair  Psychomotor Activity:  Normal  Concentration:  Concentration: Fair and Attention Span: Fair  Recall:  [place] of Knowledge:  Good  Language:  Good  Akathisia:  No  Handed:    AIMS (if indicated):     Assets:  Communication Skills  ADL's:  Intact  Cognition:  WNL  Sleep:  Number of Hours: 6.15     Have you used any form of tobacco in the last 30 days? (Cigarettes, Smokeless Tobacco, Cigars, and/or Pipes): No  Has this patient used any form of tobacco in the last 30 days? (Cigarettes, Smokeless Tobacco, Cigars, and/or Pipes) Yes, No  Blood Alcohol level:  Lab Results  Component Value Date   ETH <5 04/07/2017   ETH <5 37/16/9678    Metabolic Disorder Labs:  Lab Results  Component Value Date   HGBA1C 6.4 (H) 01/12/2017   MPG 137 01/12/2017   Lab Results  Component Value Date   PROLACTIN 42.2 (H) 01/12/2017   Lab Results  Component Value Date   CHOL 258 (H) 01/12/2017   TRIG 190 (H) 01/12/2017   HDL 60 01/12/2017   CHOLHDL 4.3 01/12/2017   VLDL 38 01/12/2017   LDLCALC 160 (H) 01/12/2017   Results for BRETTON, TANDY (MRN [id number]) as of 04/15/2017 12:05  Ref. Range 04/07/2017 21:50 04/08/2017 05:30 04/08/2017 09:17 04/08/2017 09:50 04/09/2017 10:18  COMPREHENSIVE METABOLIC PANEL Unknown Rpt (A)      Sodium Latest Ref Range: 135 - 145 mmol/L 139       Potassium Latest Ref Range: 3.5 - 5.1 mmol/L 3.7      Chloride Latest Ref Range: 101 - 111 mmol/L 101      CO2 Latest Ref Range: 22 - 32 mmol/L 28      Glucose Latest Ref Range: 65 - 99 mg/dL 160 (H)      BUN Latest Ref Range: 6 - 20 mg/dL 33 (H)      Creatinine Latest Ref Range: 0.61 - 1.24 mg/dL 1.40 (H)      Calcium Latest Ref Range: 8.9 - 10.3 mg/dL 9.3      Anion gap Latest Ref Range: 5 - 15  10      Alkaline Phosphatase Latest Ref  Range: 38 - 126 U/L 69      Albumin Latest Ref Range: 3.5 - 5.0 g/dL 4.2      AST Latest Ref Range: 15 - 41 U/L 32      ALT Latest Ref Range: 17 - 63 U/L 23      Total Protein Latest Ref Range: 6.5 - 8.1 g/dL 7.3      Total Bilirubin Latest Ref Range: 0.3 - 1.2 mg/dL 0.4      EGFR (African American) Latest Ref Range: >60 mL/min 59 (L)      EGFR (Non-African Amer.) Latest Ref Range: >60 mL/min 51 (L)      Vitamin B12 Latest Ref Range: 180 - 914 pg/mL     403  WBC Latest Ref Range: 4.0 - 10.5 K/uL 10.4      RBC Latest Ref Range: 4.22 - 5.81 MIL/uL 4.85      Hemoglobin Latest Ref Range: 13.0 - 17.0 g/dL 13.6      HCT Latest Ref Range: 39.0 - 52.0 % 40.0      MCV Latest Ref Range: 78.0 - 100.0 fL 82.5      MCH Latest Ref Range: 26.0 - 34.0 pg 28.0      MCHC Latest Ref Range: 30.0 - 36.0 g/dL 34.0      RDW Latest Ref Range: 11.5 - 15.5 % 13.6      Platelets Latest Ref Range: 150 - 400 K/uL 177      Neutrophils Latest Units: % 65      Lymphocytes Latest Units: % 20      Monocytes Relative Latest Units: % 14      Eosinophil Latest Units: % 1      Basophil Latest Units: % 0      NEUT# Latest Ref Range: 1.7 - 7.7 K/uL 6.8      Lymphocyte # Latest Ref Range: 0.7 - 4.0 K/uL 2.0      Monocyte # Latest Ref Range: 0.1 - 1.0 K/uL 1.5 (H)      Eosinophils Absolute Latest Ref Range: 0.0 - 0.7 K/uL 0.1      Basophils Absolute Latest Ref Range: 0.0 - 0.1 K/uL 0.0      Acetaminophen (Tylenol), S Latest Ref Range: 10 - 30 ug/mL <44 (L)      Salicylate Lvl Latest Ref  Range: 2.8 - 30.0 mg/dL <7.0      TSH Latest Ref Range: 0.450 - 4.500 uIU/mL     2.430  Thyroxine (T4) Latest Ref Range: 4.5 - 12.0 ug/dL     6.2  Free Thyroxine Index Latest Ref Range: 1.2 - 4.9      1.6  T3 Uptake Ratio Latest Ref Range: 24 - 39 %     26  URINALYSIS, ROUTINE W REFLEX MICROSCOPIC Unknown   Rpt (A)    Appearance Latest Ref Range: CLEAR    TURBID (A)    Bacteria, UA Latest Ref Range: NONE SEEN    NONE SEEN    Bilirubin Urine Latest Ref Range: NEGATIVE    NEGATIVE    Color, Urine Latest Ref Range: YELLOW    YELLOW    Glucose Latest Ref Range: NEGATIVE mg/dL   NEGATIVE    Hgb urine dipstick Latest Ref Range: NEGATIVE    NEGATIVE    Hyaline Casts, UA Unknown   PRESENT    Ketones, ur Latest Ref Range: NEGATIVE mg/dL   NEGATIVE    Leukocytes, UA Latest Ref Range: NEGATIVE    NEGATIVE  Mucous Unknown   PRESENT    Nitrite Latest Ref Range: NEGATIVE    NEGATIVE    pH Latest Ref Range: 5.0 - 8.0    5.0    Protein Latest Ref Range: NEGATIVE mg/dL   30 (A)    RBC / HPF Latest Ref Range: 0 - 5 RBC/hpf   0-5    Specific Gravity, Urine Latest Ref Range: 1.005 - 1.030    1.027    Squamous Epithelial / LPF Latest Ref Range: NONE SEEN    NONE SEEN    WBC, UA Latest Ref Range: 0 - 5 WBC/hpf   0-5    Alcohol, Ethyl (B) Latest Ref Range: <5 mg/dL <5      Amphetamines Latest Ref Range: NONE DETECTED   NONE DETECTED     Barbiturates Latest Ref Range: NONE DETECTED   NONE DETECTED     Benzodiazepines Latest Ref Range: NONE DETECTED   NONE DETECTED     Opiates Latest Ref Range: NONE DETECTED   NONE DETECTED     COCAINE Latest Ref Range: NONE DETECTED   NONE DETECTED     Tetrahydrocannabinol Latest Ref Range: NONE DETECTED   NONE DETECTED     DG CHEST 2 VIEW Unknown    Rpt     See Psychiatric Specialty Exam and Suicide Risk Assessment completed by Attending Physician prior to discharge.  Discharge destination:  Other:  shelter  Is patient on multiple antipsychotic therapies at  discharge:  No   Has Patient had three or more failed trials of antipsychotic monotherapy by history:  No  Recommended Plan for Multiple Antipsychotic Therapies: NA   Allergies as of 04/15/2017   No Known Allergies     Medication List    STOP taking these medications   hydrOXYzine 25 MG tablet Commonly known as:  ATARAX/VISTARIL     TAKE these medications     Indication  DULoxetine 60 MG capsule Commonly known as:  CYMBALTA Take 1 capsule (60 mg total) by mouth daily. What changed:  medication strength  how much to take  when to take this  additional instructions  Indication:  Major Depressive Disorder   risperiDONE 2 MG tablet Commonly known as:  RISPERDAL Take 1 tablet (2 mg total) by mouth at bedtime.  Indication:  Manic-Depression   traZODone 100 MG tablet Commonly known as:  DESYREL Take 1 tablet (100 mg total) by mouth at bedtime. What changed:  when to take this  reasons to take this  Indication:  Trouble Sleeping      Follow-up [employer]. Go on 04/18/2017.   Specialty:  Behavioral Health Why:  Please follow-up with [facility] on Monday, July 2nd at [facility] for your hospital discharge appointment. It is important to bring your discharge paperwork to this appointment. And arrive early to the walk-in clinic. Contact information: [place] [address] [phone]        Benito Mccreedy, MD. Go on 04/18/2017.   Specialty:  Internal Medicine Why:  Your appointment is at 3:30pm. Please bring a current list of your medications and your insurance cards. Contact information: [place] [address] [phone]           >30 minutes. >50 % of the time was spent in coordination of care  Signed: Hildred Priest, MD 04/15/2017, 12:48 PM

## 2017-04-14 NOTE — BHH Group Notes (Signed)
[facility] LCSW Group Therapy Note  Type of Therapy and Topic:  Group Therapy:  Goals Group: SMART Goals  Participation Level:  Patient attended group on this date. Patient participated in goal setting and was able to share openly with the group.   Description of Group:   The purpose of a daily goals group is to assist and guide patients in setting recovery/wellness-related goals.  The objective is to set goals as they relate to the crisis in which they were admitted. Patients will be using SMART goal modalities to set measurable goals.  Characteristics of realistic goals will be discussed and patients will be assisted in setting and processing how one will reach their goal. Facilitator will also assist patients in applying interventions and coping skills learned in psycho-education groups to the SMART goal and process how one will achieve defined goal.  Therapeutic Goals: -Patients will develop and document one goal related to or their crisis in which brought them into treatment. -Patients will be guided by LCSW using SMART goal setting modality in how to set a measurable, attainable, realistic and time sensitive goal.  -Patients will process barriers in reaching goal. -Patients will process interventions in how to overcome and successful in reaching goal.   Summary of Patient Progress:  Patient Goal: "I want to be discharged and find a job".   Therapeutic Modalities:   Motivational Interviewing Public relations account executive Therapy Crisis Intervention Model SMART goals setting  Caleb Berg G. [place], [facility] 04/14/2017 10:31 AM

## 2017-04-14 NOTE — Progress Notes (Signed)
Pt calm, cooperative and pleasant.  Focused on discharge, which he anticipated would be today.  "As long as they find me a place to live."  Worried affect.  Pt notified that discharge would be planned for Friday.  Pt appeared slightly deflated with this information but reacted appropriately and calmly.  Reports depression as 5/10, hopelessness as 3/10 and anxiety as 3/10.  Observed on unit interacting with peers, laughing socially outside.  Med compliant without encouragement.  No behavioral issues.

## 2017-04-14 NOTE — Progress Notes (Signed)
Recreation Therapy Notes  Date: 06.28.18 Time: 1:00 pm Location: Craft Room  Group Topic: Leisure Education  Goal Area(s) Addresses:  Patient will identify what type of leisure they prefer (health and wellness/social/etc.) Patient will identify one activity they would like to participate in that addresses their personal leisure interest. Patient will identify one leisure activity they currently participate in that addresses their personal leisure interest.  Behavioral Response: Attentive, Interactive  Intervention: Leisure Skills Checklist  Activity: Patients were given a Leisure Skills Checklist and a scoring sheet and were instructed to fill out the checklist and add up their scores.  Education: LRT educated patients on what they need to participate in leisure.  Education Outcome: Acknowledges education/In group clarification offered  Clinical Observations/Feedback: Patient completed Leisure Skills Checklist and added up his score. Patient contributed to group discussion by stating that he agreed with the result from his checklist, how leisure can be social, relaxing, physical, and intellectual, how leisure can help improve health, how leisure can help a person feel positive, and adventurous, what the world would be like without leisure, and how he can improve his life from this checklist.  Leonette Monarch, LRT/CTRS 04/14/2017 1:54 PM

## 2017-04-14 NOTE — Plan of Care (Signed)
Problem: Health Behavior/Discharge Planning: Goal: Compliance with therapeutic regimen will improve Outcome: Progressing Compliant with scheduled meds without encouragement

## 2017-04-14 NOTE — Progress Notes (Signed)
[facility] MD Progress Note  04/14/2017 6:05 PM Caleb Berg  MRN:  [id number] Subjective:  66 year old Caucasian male who presented to [facility] emergency department on June 22 with complaints of depression  Patient reported hyperphagia, feelings of guilt and low energy. He denied SI or HI. He also denied any psychotic symptoms. He denies having any history of symptoms consistent with mania or hypomania.  Patient is currently unemployed. He is on the verge of becoming homeless. His car was recently repossessed. He is unable to pay his bills. Patient was initially working 40 hours but his hours were decreased to 16 so he quit that job and has not been able to find another one.  Patient attempted suicide earlier this year by overdosing on sleeping medication. He was admitted to behavioral health in [place].  6/24 patient denies having suicidality. He still appears very distressed about his problems at home. His mood was somewhat irritable. He denies problems with sleep, appetite, energy or concentration. Denies hallucinations. Denies homicidal ideation or physical complaints. Denies side effects from medications.  Family was contacted by Education officer, museum. He reported the patient has a history of gambling and had debt even before he lost his job. They're willing to help with co-pays from appointments and medications.  6/25 patient appears still to be dysphoric and at times irritable. He tells me he continues to have passive suicidal ideation, he wishes he will go to sleep and never had to wake up. He denies any side effects from medications or having any physical complaints. He denies homicidal ideation or psychosis. He denies problems with appetite, energy, sleep or concentration. Still overwhelmed about his financial issues. The patient explains that he has already moved out of his apartment and for the last few days he had been staying in a motel. He had not told his family.  6/26 patient seems  less dysphoric and his affect is not as constricted. He has been participating actively in all groups. He is pleasant and cooperative with staff and peers. He denies side effects from medications or having any physical complaints. Says he has been eating well and is states his sleep is good. Denies SI, HI or hallucinations.  6/27 patient denies any problems with sleep, appetite, energy or concentration. He says he still feels depressed but feels less anxious and less helpless than at admission. He is tolerating medication well. Denies any side effects. Denies suicidality, homicidality or psychosis.  I spoke with the patient's brother yesterday who had concerns about possible bipolar disorder, cognitive issues and gambling addiction.  Brother stated that sometimes his brother would not call and be depressed and at the next day he'll be almost euphoric. They also have observed significant forgetfulness. He believes that the patient divorced because of gambling problems. A significant history of bipolar disorder as there are 2 siblings that have been diagnosed with and one of them has been hospitalized. They also have a brother with dementia.  We completed a MOCA screening history score was 22 which is in the range of mild cognitive disorder.  He denies all symptoms consistent with mania or hypomania. He also denies any history or issues with gambling since he has been in [place].  6/28 Denies any concerns or problems. Mood is still dysphoric, he denies SI, HI or hallucinations. Denies problems with appetite or sleep.Tolerating well medications. Family feels very concern about him as he threw away computer, tv and other belongings prior to coming in to hospital.  Turned in  his apartment key before his lease was up. Pt gave brother his bank account password.  Pt's brother saw he recently spent $100 in a gambling side. Pt continues to say he has not gamble in years.   Per nursing: Pt calm,  cooperative and pleasant.  Focused on discharge, which he anticipated would be today.  "As long as they find me a place to live."  Worried affect.  Pt notified that discharge would be planned for Friday.  Pt appeared slightly deflated with this information but reacted appropriately and calmly.  Reports depression as 5/10, hopelessness as 3/10 and anxiety as 3/10.  Observed on unit interacting with peers, laughing socially outside.  Med compliant without encouragement.  No behavioral issues.   Principal Problem: Bipolar disorder ([place]) Diagnosis:   Patient Active Problem List   Diagnosis Date Noted  . Bipolar disorder ([place]) [F31.9] 04/13/2017  . 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 He was admitted there after an overdose. He was restarted on Cymbalta and trazodone. He had been following up outpatient but 2 weeks ago he stopped his medications because he was unable to afford them.  Past Medical History:  Past Medical History:  Diagnosis Date  . Depression     Past Surgical History:  Procedure Laterality Date  . HERNIA REPAIR     Family History: History reviewed. No pertinent family history.   Family Psychiatric  History: Father suffered from depression. He also has a younger brother  And sister who were  diagnosed with depression  Social History:  Patient is divorced. He has one daughter who is 23 years old. No very close to her only occasional communication. Lives alone. He has 2 years of college education. 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 Denies having any legal history.  History  Alcohol Use No     History  Drug Use No    Social History   Social History  . Marital status: Married    Spouse name: N/A  . Number of children: N/A  . Years of  education: N/A   Social History Main Topics  . Smoking status: Never Smoker  . Smokeless tobacco: Never Used  . Alcohol use No  . Drug use: No  . Sexual activity: Not Currently    Birth control/ protection: Abstinence   Other Topics Concern  . None   Social History Narrative  . None     Current Medications: Current Facility-Administered Medications  Medication Dose Route Frequency Provider Last Rate Last Dose  . acetaminophen (TYLENOL) tablet 650 mg  650 mg Oral Q6H PRN Ethelene Hal, NP      . alum & mag hydroxide-simeth (MAALOX/MYLANTA) 200-200-20 MG/5ML suspension 30 mL  30 mL Oral Q4H PRN Ethelene Hal, NP      . DULoxetine (CYMBALTA) DR capsule 60 mg  60 mg Oral Daily Ethelene Hal, NP   60 mg at 04/14/17 0831  . magnesium hydroxide (MILK OF MAGNESIA) suspension 30 mL  30 mL Oral Daily PRN Ethelene Hal, NP      . risperiDONE (RISPERDAL) tablet 1 mg  1 mg Oral QHS Hildred Priest, MD      . traZODone (DESYREL) tablet 100 mg  100 mg Oral QHS Hildred Priest, MD   100 mg at 04/13/17  2155    Lab Results:  No results found for this or any previous visit (from the past 48 hour(s)).  Blood Alcohol level:  Lab Results  Component Value Date   ETH <5 04/07/2017   ETH <5 45/80/9983    Metabolic Disorder Labs: Lab Results  Component Value Date   HGBA1C 6.4 (H) 01/12/2017   MPG 137 01/12/2017   Lab Results  Component Value Date   PROLACTIN 42.2 (H) 01/12/2017   Lab Results  Component Value Date   CHOL 258 (H) 01/12/2017   TRIG 190 (H) 01/12/2017   HDL 60 01/12/2017   CHOLHDL 4.3 01/12/2017   VLDL 38 01/12/2017   LDLCALC 160 (H) 01/12/2017    Physical Findings: AIMS: Facial and Oral Movements Muscles of Facial Expression: None, normal Lips and Perioral Area: None, normal Jaw: None, normal Tongue: None, normal,Extremity Movements Upper (arms, wrists, hands, fingers): None, normal Lower (legs, knees, ankles,  toes): None, normal, Trunk Movements Neck, shoulders, hips: None, normal, Overall Severity Severity of abnormal movements (highest score from questions above): None, normal Incapacitation due to abnormal movements: None, normal Patient's awareness of abnormal movements (rate only patient's report): No Awareness, Dental Status Current problems with teeth and/or dentures?: No Does patient usually wear dentures?: No  CIWA:    COWS:     Musculoskeletal: Strength & Muscle Tone: within normal limits Gait & Station: normal Patient leans: N/A  Psychiatric Specialty Exam: Physical Exam  Constitutional: He is oriented to person, place, and time. He appears well-developed and well-nourished.  HENT:  Head: Normocephalic and atraumatic.  Eyes: Conjunctivae and EOM are normal.  Neck: Normal range of motion.  Respiratory: Effort normal.  Musculoskeletal: Normal range of motion.  Neurological: He is alert and oriented to person, place, and time.    Review of Systems  Constitutional: Negative.   HENT: Negative.   Eyes: Negative.   Respiratory: Negative.   Cardiovascular: Negative.   Gastrointestinal: Negative.   Genitourinary: Negative.   Musculoskeletal: Negative.   Skin: Negative.   Neurological: Negative.   Endo/Heme/Allergies: Negative.   Psychiatric/Behavioral: Positive for depression. Negative for suicidal ideas.    Blood pressure 119/88, pulse 79, temperature 98 F (36.7 C), temperature source Oral, resp. rate 18, height 5\' 8"  (1.727 m), weight 77.1 kg (170 lb), SpO2 99 %.Body mass index is 25.85 kg/m.  General Appearance: Well Groomed  Eye Contact:  Good  Speech:  Clear and Coherent  Volume:  Normal  Mood:  Dysphoric  Affect:  Appropriate and Congruent  Thought Process:  Linear and Descriptions of Associations: Intact  Orientation:  Full (Time, Place, and Person)  Thought Content:  Hallucinations: None  Suicidal Thoughts:  No  Homicidal Thoughts:  No  Memory:  Immediate;    Good Recent;   Good Remote;   Good  Judgement:  Poor  Insight:  Shallow  Psychomotor Activity:  Decreased  Concentration:  Concentration: Fair and Attention Span: Fair  Recall:  Good  Fund of Knowledge:  Good  Language:  Good  Akathisia:  No  Handed:    AIMS (if indicated):     Assets:  Communication Skills  ADL's:  Intact  Cognition:  WNL  Sleep:  Number of Hours: 7     Treatment Plan Summary:  Patient is a 66 year old divorced Caucasian male with major depressive disorder, one prior suicidal attempt and very limited social support presents with worsening depression in the setting of severe financial distress and unemployment.  Major depressive disorder: Patient requests to  be continued on Cymbalta 60 mg a day. Has been attending all groups  Possible bipolar disorder based on the collateral information in his family history of having 2 siblings with the disorder. 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 Potentially the cognitive issues could be secondary to depression. However I recommend to retest in 6 months-12 m as this could be the beginnings of a more serious neurocognitive disorder.  Labs: TSH and vitamin B12---wnl  Diet regular  Hospitalization and status voluntary  Precautions every 15 minute checks  Vital signs daily  Disposition: Unfortunately the patient has nowhere to go. The family initially stated that they will help with the cost of a hotel but later changed their mind.  SW has not found any beds at local shelters.  Brother said today he will pay for a hotel and a weekly bus pass.  He is working on that today.   Follow up patient will be schedule to f/u out with psychiatry.  Plan to discharge  tomorrow   Hildred Priest, MD 04/14/2017, 6:05 PM

## 2017-04-14 NOTE — Progress Notes (Signed)
Patient stayed in room but was able to get up and come to the medication room. Calm and cooperative. Denying suicidal/homicidal thoughts. Denying hallucinations. Avoiding to discuss his condition in detail. Appears to be worried but avoiding to talk to staff. Went right back to bed after medications. Currently sleeping: no sign of discomfort. Safety precautions maintained.

## 2017-04-14 NOTE — Plan of Care (Signed)
Problem: Pain Managment: Goal: General experience of comfort will improve Outcome: Progressing Remains free from pain

## 2017-04-15 DIAGNOSIS — R7303 Prediabetes: Secondary | ICD-10-CM

## 2017-04-15 MED ORDER — RISPERIDONE 2 MG PO TABS: 2.0000 mg | ORAL_TABLET | Freq: Every day | ORAL | 0 refills | Status: AC

## 2017-04-15 NOTE — BHH Group Notes (Signed)
[place] LCSW Group Therapy  04/15/2017 10:24 AM  Type of Therapy:  Group Therapy  Participation Level:  Patient did not attend group. CSW invited patient to group.   Summary of Progress/Problems: Feelings around Relapse. Group members discussed the meaning of relapse and shared personal stories of relapse, how it affected them and others, and how they perceived themselves during this time. Group members were encouraged to identify triggers, warning signs and coping skills used when facing the possibility of relapse. Social supports were discussed and explored in detail. Patients also discussed facing disappointment and how that can trigger someone to relapse.  Marguriete Wootan G. Claybon Jabs MSW, [place] 04/15/2017, 10:24 AM

## 2017-04-15 NOTE — Tx Team (Signed)
Interdisciplinary Treatment and Diagnostic Plan Update  04/15/2017 Time of Session: 11:00AM Caleb Berg MRN: [id number]  Principal Diagnosis: Bipolar disorder [facility])  Secondary Diagnoses: Principal Problem:   Bipolar disorder ([place]) Active Problems:   Mild neurocognitive disorder   Prediabetes   Current Medications:  Current Facility-Administered Medications  Medication Dose Route Frequency Provider Last Rate Last Dose  . acetaminophen (TYLENOL) tablet 650 mg  650 mg Oral Q6H PRN Ethelene Hal, NP      . alum & mag hydroxide-simeth (MAALOX/MYLANTA) 200-200-20 MG/5ML suspension 30 mL  30 mL Oral Q4H PRN Ethelene Hal, NP      . DULoxetine (CYMBALTA) DR capsule 60 mg  60 mg Oral Daily Ethelene Hal, NP   60 mg at 04/15/17 0805  . magnesium hydroxide (MILK OF MAGNESIA) suspension 30 mL  30 mL Oral Daily PRN Ethelene Hal, NP      . risperiDONE (RISPERDAL) tablet 1 mg  1 mg Oral QHS Hildred Priest, MD   1 mg at 04/14/17 2212  . traZODone (DESYREL) tablet 100 mg  100 mg Oral QHS Hildred Priest, MD   100 mg at 04/14/17 2212   PTA Medications: Prescriptions Prior to Admission  Medication Sig Dispense Refill Last Dose  . DULoxetine (CYMBALTA) 20 MG capsule Take 1 capsule (20 mg total) by mouth 2 (two) times daily. For depression (Patient not taking: Reported on 04/07/2017) 60 capsule 0 Not Taking at Unknown time  . hydrOXYzine (ATARAX/VISTARIL) 25 MG tablet Take 1 tablet (25 mg total) by mouth every 6 (six) hours as needed for anxiety. (Patient not taking: Reported on 04/07/2017) 60 tablet 0 Not Taking at Unknown time  . traZODone (DESYREL) 100 MG tablet Take 1 tablet (100 mg total) by mouth at bedtime as needed for sleep. (Patient not taking: Reported on 04/07/2017) 30 tablet 0 Not Taking at Unknown time    Patient Stressors: Financial difficulties Occupational concerns  Patient Strengths: Active sense of humor Average or above  average intelligence Motivation for treatment/growth  Treatment Modalities: Medication Management, Group therapy, Case management,  1 to 1 session with clinician, Psychoeducation, Recreational therapy.   Physician Treatment Plan for Primary Diagnosis: Bipolar disorder ([place]) Long Term Goal(s): Improvement in symptoms so as ready for discharge Improvement in symptoms so as ready for discharge   Short Term Goals: Ability to verbalize feelings will improve Compliance with prescribed medications will improve Ability to identify and develop effective coping behaviors will improve  Medication Management: Evaluate patient's response, side effects, and tolerance of medication regimen.  Therapeutic Interventions: 1 to 1 sessions, Unit Group sessions and Medication administration.  Evaluation of Outcomes: Adequate for discharge per MD   Physician Treatment Plan for Secondary Diagnosis: Principal Problem:   Bipolar disorder ([place]) Active Problems:   Mild neurocognitive disorder   Prediabetes  Long Term Goal(s): Improvement in symptoms so as ready for discharge Improvement in symptoms so as ready for discharge   Short Term Goals: Ability to verbalize feelings will improve Compliance with prescribed medications will improve Ability to identify and develop effective coping behaviors will improve     Medication Management: Evaluate patient's response, side effects, and tolerance of medication regimen.  Therapeutic Interventions: 1 to 1 sessions, Unit Group sessions and Medication administration.  Evaluation of Outcomes: Adequate for discharge per MD    RN Treatment Plan for Primary Diagnosis: Bipolar disorder ([place]) Long Term Goal(s): Knowledge of disease and therapeutic regimen to maintain health will improve  Short Term Goals: Ability to  demonstrate self-control, Ability to disclose and discuss suicidal ideas and Compliance with prescribed medications will improve  Medication  Management: RN will administer medications as ordered by provider, will assess and evaluate patient's response and provide education to patient for prescribed medication. RN will report any adverse and/or side effects to prescribing provider.  Therapeutic Interventions: 1 on 1 counseling sessions, Psychoeducation, Medication administration, Evaluate responses to treatment, Monitor vital signs and CBGs as ordered, Perform/monitor CIWA, COWS, AIMS and Fall Risk screenings as ordered, Perform wound care treatments as ordered.  Evaluation of Outcomes: Adequate for discharge per MD    LCSW Treatment Plan for Primary Diagnosis: Bipolar disorder [facility]) Long Term Goal(s): Safe transition to appropriate next level of care at discharge, Engage patient in therapeutic group addressing interpersonal concerns.  Short Term Goals: Engage patient in aftercare planning with referrals and resources, Increase social support and Increase emotional regulation  Therapeutic Interventions: Assess for all discharge needs, 1 to 1 time with Social worker, Explore available resources and support systems, Assess for adequacy in community support network, Educate family and significant other(s) on suicide prevention, Complete Psychosocial Assessment, Interpersonal group therapy.  Evaluation of Outcomes: Adequate for discharge per MD    Recreational Therapy Treatment Plan for Primary Diagnosis: Bipolar disorder [facility]) Long Term Goal(s): Patient will participate in recreation therapy treatment in at least 2 group sessions without prompting from [place]: Increase self-esteem, Increase stress management skills  Treatment Modalities: Group Therapy and Individual Treatment Sessions  Therapeutic Interventions: Psychoeducation  Evaluation of Outcomes: Adequate for discharge per MD    Progress in Treatment: Attending groups: Yes. Participating in groups: Yes. Taking medication as prescribed: Yes. Toleration  medication: Yes. Family/Significant other contact made: Yes, individual(s) contacted:  CSW spoke with patient's brother Patient understands diagnosis: Yes. Discussing patient identified problems/goals with staff: Yes. Medical problems stabilized or resolved: Yes. Denies suicidal/homicidal ideation: Yes. Issues/concerns per patient self-inventory: No.  New problem(s) identified: No, Describe:  None identified  New Short Term/Long Term Goal(s): Patient stated that his goal is to find a job at discharge.  Discharge Plan or Barriers: CSW assessing appropriate discharge plan.  Reason for Continuation of Hospitalization: Depression Suicidal ideation  Estimated Length of Stay: D/C 04/15/2017  Attendees: Patient: Caleb Berg  04/15/2017 1:08 PM  Physician: Dr. Merlyn Albert, MD  04/15/2017 1:08 PM  Nursing: Elige Radon, BSN, RN  04/15/2017 1:08 PM  RN Care Manager: 04/15/2017 1:08 PM  Social Worker: Glorious Peach, MSW, [place] 04/15/2017 1:08 PM  Recreational Therapist: Drue Flirt, LRT/CTRS  04/15/2017 1:08 PM  Other: Pasty Spillers, [place] Student  04/15/2017 1:08 PM  Other:  04/15/2017 1:08 PM  Other: 04/15/2017 1:08 PM    Scribe for Treatment Team: Emilie Rutter, [place] 04/15/2017 1:08 PM

## 2017-04-15 NOTE — Progress Notes (Signed)
Patient was visible in the milieu before bedtime. Mostly in the day room, interacting with peers. Alert and oriented. Denying suicidal thoughts. Reports that he is sleeping better. Pt currently in bed and staff continue to monitor.

## 2017-04-15 NOTE — Progress Notes (Signed)
   04/15/17 0945  Clinical Encounter Type  Visited With Patient;Other (Comment)  Visit Type Follow-up;Behavioral Health   [place] spoke with pt in hallway to follow-up and hear about his discharge. Pt stated he is getting discharged tomorrow and is going to go to [employer] to use computers and open bank account for his checks. Pt shared about his brother that isn't that faraway and how he would be a little bit of help to him. Pt at this point didn't know exactly where he would be going but appeared to be in good spirits as he was being released.

## 2017-04-15 NOTE — Plan of Care (Signed)
Problem: Health Behavior/Discharge Planning: Goal: Ability to manage health-related needs will improve Outcome: Progressing Taking prescribed medications. Motivated for treatment

## 2017-04-15 NOTE — Progress Notes (Signed)
Recreation Therapy Notes  Date: 06.29.18 Time: 1:00 pm Location: Craft Room  Group Topic: Coping Skills  Goal Area(s) Addresses:  Patient will verbalize importance of recognizing emotions. Patient will identify at least one emotion. Patient will identify healthy coping skills for emotions.  Behavioral Response: Did not attend  Intervention: Emotion Wheel  Activity: Patients were given an Emotion Wheel worksheet and were instructed to as a group come up with 8 emotions towards their recovery and write the emotions on the outside of the wheel. On the inside, patients were instructed to write healthy coping skills for each emotion.  Education: LRT educated patients on healthy coping skills.  Education Outcome: Patient did not attend group.   Clinical Observations/Feedback: Patient did not attend group.  Leonette Monarch, LRT/CTRS 04/15/2017 1:53 PM

## 2017-04-15 NOTE — Plan of Care (Signed)
Problem: Coping: Goal: Ability to cope will improve Outcome: Progressing Patient expresses that his ability to cope with his depression is improving with the medication he is on for his mood. Will continue to monitor. Goal: Ability to verbalize feelings will improve Outcome: Progressing Patient continue to verbalize feelings of depression, but states his mood is improving everyday.

## 2017-04-15 NOTE — Plan of Care (Signed)
Problem: Activity: Goal: Sleeping patterns will improve Outcome: Progressing "I have been sleeping fantastic for the last two nights"

## 2017-04-15 NOTE — Plan of Care (Signed)
Problem: Safety: Goal: Ability to remain free from injury will improve Outcome: Progressing Fall precautions maintained

## 2017-04-15 NOTE — Progress Notes (Signed)
Recreation Therapy Notes  INPATIENT RECREATION TR PLAN  Patient Details Name: Caleb Berg MRN: [id number] DOB: 1951-04-10 Today's Date: 04/15/2017  Rec Therapy Plan Is patient appropriate for Therapeutic Recreation?: Yes Treatment times per week: At least once a week TR Treatment/Interventions: 1:1 session, Group participation (Comment) (Appropriate participation in daily recreational therapy tx)  Discharge Criteria Pt will be discharged from therapy if:: Treatment goals are met, Discharged Treatment plan/goals/alternatives discussed and agreed upon by:: Patient/family  Discharge Summary Short term goals set: See Care Plan Short term goals met: Complete Progress toward goals comments: One-to-one attended Which groups?: Self-esteem, Wellness, Leisure education, Other (Comment) (Self-expression) One-to-one attended: Self-esteem, stress management Reason goals not met: N/A Therapeutic equipment acquired: None Reason patient discharged from therapy: Discharge from hospital Pt/family agrees with progress & goals achieved: Yes Date patient discharged from therapy: 04/15/17   Leonette Monarch, LRT/CTRS  04/15/2017, 3:37 PM

## 2017-04-15 NOTE — Plan of Care (Signed)
Problem: Coping: Goal: Ability to cope will improve Outcome: Progressing Pt able to participate in assessment and to express his feelings as needed.

## 2017-04-15 NOTE — Progress Notes (Signed)
  [facility] Adult Case Management Discharge Plan :  Will you be returning to the same living situation after discharge:  No. At discharge, do you have transportation home?: Yes,  brother will pick patient up. Do you have the ability to pay for your medications: Yes,  medicare  Release of information consent forms completed and in the chart;  Patient's signature needed at discharge.  Patient to Follow up at: Follow-up Information    [facility]. Go on 04/18/2017.   Specialty:  Behavioral Health Why:  Please follow-up with [facility] on Monday, July 2nd at [facility] for your hospital discharge appointment. It is important to bring your discharge paperwork to this appointment. And arrive early to the walk-in clinic. Contact information: [place] [phone]           Next level of care provider has access to [place] and Suicide Prevention discussed: Yes,  SPE completed with patient's brother  Have you used any form of tobacco in the last 30 days? (Cigarettes, Smokeless Tobacco, Cigars, and/or Pipes): No  Has patient been referred to the Quitline?: N/A patient is not a smoker  Patient has been referred for addiction treatment: [facility], MSW, LCSW-A 04/15/2017, 11:37 AM

## 2017-04-15 NOTE — Plan of Care (Signed)
Problem: Pain Managment: Goal: General experience of comfort will improve Outcome: Progressing Pain assessment performed every shift and as needed. Denies pain

## 2017-04-15 NOTE — [facility] Group Notes (Signed)
[facility] Group Notes:  (Nursing/MHT/Case Management/Adjunct)  Date:  04/15/2017  Time:  4:29 AM  Type of Therapy:  Psychoeducational Skills  Participation Level:  Active  Participation Quality:  Appropriate, Attentive and Sharing  Affect:  Appropriate  Cognitive:  Appropriate  Insight:  Appropriate  Engagement in Group:  Engaged  Modes of Intervention:  Discussion, Socialization and Support  Summary of Progress/Problems:  Caleb Berg 04/15/2017, 4:29 AM

## 2017-04-15 NOTE — Progress Notes (Signed)
Patient is alert and oriented x 4, breathing unlabored, and extremities x 4 within normal limits. Patient is calm and cooperative. Patient did not display any disruptive behavior. Patient denies SI/HI/VH/AH. Patient interacted with staff and peers in dayroom and hallway.Patient was excited about discharge today. Patient was provided discharge instructions and prescriptions per MD order and verbalized understanding. Patient received all his personal belongings. Patient brother picked him up and left via car.

## 2017-07-12 ENCOUNTER — Emergency Department (HOSPITAL_COMMUNITY): Payer: Medicare HMO

## 2017-07-12 ENCOUNTER — Encounter (HOSPITAL_COMMUNITY): Payer: Self-pay | Admitting: Neurology

## 2017-07-12 ENCOUNTER — Emergency Department (HOSPITAL_COMMUNITY)
Admission: EM | Admit: 2017-07-12 | Discharge: 2017-07-12 | Disposition: A | Discharge status: Home / Self Care | Payer: Medicare HMO | Attending: Emergency Medicine | Admitting: Emergency Medicine

## 2017-07-12 DIAGNOSIS — M25561 Pain in right knee: Secondary | ICD-10-CM | POA: Diagnosis present

## 2017-07-12 DIAGNOSIS — M7041 Prepatellar bursitis, right knee: Secondary | ICD-10-CM

## 2017-07-12 DIAGNOSIS — Z79899 Other long term (current) drug therapy: Secondary | ICD-10-CM | POA: Insufficient documentation

## 2017-07-12 DIAGNOSIS — Y9301 Activity, walking, marching and hiking: Secondary | ICD-10-CM | POA: Diagnosis not present

## 2017-07-12 MED ORDER — MELOXICAM 15 MG PO TABS: 15.0000 mg | ORAL_TABLET | Freq: Every day | ORAL | 0 refills | Status: AC

## 2017-07-12 NOTE — ED Provider Notes (Signed)
[facility] DEPT Provider Note   CSN: [id number] Arrival date & time: 07/12/17  8527     History   Chief Complaint Chief Complaint  Patient presents with  . Knee Pain    HPI Caleb Berg is a 66 y.o. malewho presents for several months of R knee pain since he had a fall of a cement floor .  Last week he also sprained his Knee when he missed the last stair and jammed the knee. The patient complains of swelling over the knee cap which has been constant since he fell directly on the knee. He denies joint pain within the knee. He denies heat, fever. He has not used any medication to treat the symptoms.  HPI  Past Medical History:  Diagnosis Date  . Depression     Patient Active Problem List   Diagnosis Date Noted  . Prediabetes 04/15/2017  . Bipolar disorder ([place]) 04/13/2017  . Mild neurocognitive disorder 04/13/2017    Past Surgical History:  Procedure Laterality Date  . HERNIA REPAIR         Home Medications    Prior to Admission medications   Medication Sig Start Date End Date Taking? Authorizing Provider  DULoxetine (CYMBALTA) 60 MG capsule Take 1 capsule (60 mg total) by mouth daily. 04/14/17   Hildred Priest, MD  risperiDONE (RISPERDAL) 2 MG tablet Take 1 tablet (2 mg total) by mouth at bedtime. 04/15/17   Hildred Priest, MD  traZODone (DESYREL) 100 MG tablet Take 1 tablet (100 mg total) by mouth at bedtime. 04/13/17   Hildred Priest, MD    Family History No family history on file.  Social History Social History  Substance Use Topics  . Smoking status: Never Smoker  . Smokeless tobacco: Never Used  . Alcohol use No     Allergies   Patient has no known allergies.   Review of Systems Review of Systems Positive for knee pain and swelling with negative for fevers, chills.  Physical Exam Updated Vital Signs BP (!) 136/97 (BP Location: Right Arm)   Pulse 66   Temp 97.8 F (36.6 C) (Oral)   Resp 18   SpO2 99%    Physical Exam Physical Exam  Nursing note and vitals reviewed. Constitutional: He appears well-developed and well-nourished. No distress.  HENT:  Head: Normocephalic and atraumatic.  Eyes: Conjunctivae normal are normal. No scleral icterus.  Neck: Normal range of motion. Neck supple.  Cardiovascular: Normal rate, regular rhythm and normal heart sounds.   Pulmonary/Chest: Effort normal and breath sounds normal. No respiratory distress.  Abdominal: Soft. There is no tenderness.  Musculoskeletal: He exhibits no edema.  full range of motion of the right knee, swollen bursa over the patella, soft, tender without heat redness or erythema. Ligaments are stable. Neurovascularly intact.  Neurological: He is alert.  Skin: Skin is warm and dry. He is not diaphoretic.  Psychiatric: His behavior is normal.     ED Treatments / Results  Labs (all labs ordered are listed, but only abnormal results are displayed) Labs Reviewed - No data to display  EKG  EKG Interpretation None       Radiology Dg Knee Complete 4 Views Right  Result Date: 07/12/2017 CLINICAL DATA:  Pain and swelling. Fall a week ago. Initial encounter. EXAM: RIGHT KNEE - COMPLETE 4+ VIEW COMPARISON:  None. FINDINGS: Prepatellar soft tissue swelling without gas or opaque foreign body. Negative for fracture or malalignment. No convincing joint effusion. Atherosclerotic calcification. IMPRESSION: Prepatellar soft tissue swelling. No  acute osseous finding or opaque foreign body. Electronically Signed   By: Monte Fantasia M.D.   On: 07/12/2017 08:12    Procedures Procedures (including critical care time)  Medications Ordered in ED Medications - No data to display   Initial Impression / Assessment and Plan / ED Course  I have reviewed the triage vital signs and the nursing notes.  Pertinent labs & imaging results that were available during my care of the patient were reviewed by me and considered in my medical decision  making (see chart for details).    Patient x-ray negative for acute injury. He has a prepatellar bursitis that does not appear inflamed in any way. This is a traumatic bursitis which may take several weeks to resolve. Patient given or throw follow-up, knee sleeve, crutches, home care instructions, anti-inflammatories and return instructions. Patient appears safe for discharge at this time  Final Clinical Impressions(s) / ED Diagnoses   Final diagnoses:  None    New Prescriptions New Prescriptions   No medications on file     Margarita Mail, PA-C 07/12/17 [place], [place], DO 07/12/17 1054

## 2017-07-12 NOTE — ED Notes (Signed)
Injury to right knee several months ago. Has had continued pain. Started one month ago with "fluid on the knee". States re-injured right knee by "stepping down hard" when he missed a step, causing increased pain.

## 2017-07-12 NOTE — Discharge Instructions (Signed)
Contact a health care provider if: Your symptoms do not improve. Your symptoms get worse. Your symptoms keep coming back after treatment. You develop a fever and have warmth, redness, and swelling over your knee

## 2017-07-12 NOTE — ED Triage Notes (Signed)
Pt reports several months ago fell on cement floor, right knee pain since then. Reports swelling. Last week missed a stair, and jammed it. Now is having pain, swelling, limited ROM. Has swelling to right knee.

## 2019-03-13 IMAGING — CR DG KNEE COMPLETE 4+V*R*
4 series · 4 of 4 positions shown · non-contrast
Comparison: None.

CLINICAL DATA: Pain and swelling. Fall a week ago. Initial
encounter.

EXAM:
RIGHT KNEE - COMPLETE 4+ VIEW

[knee ap]
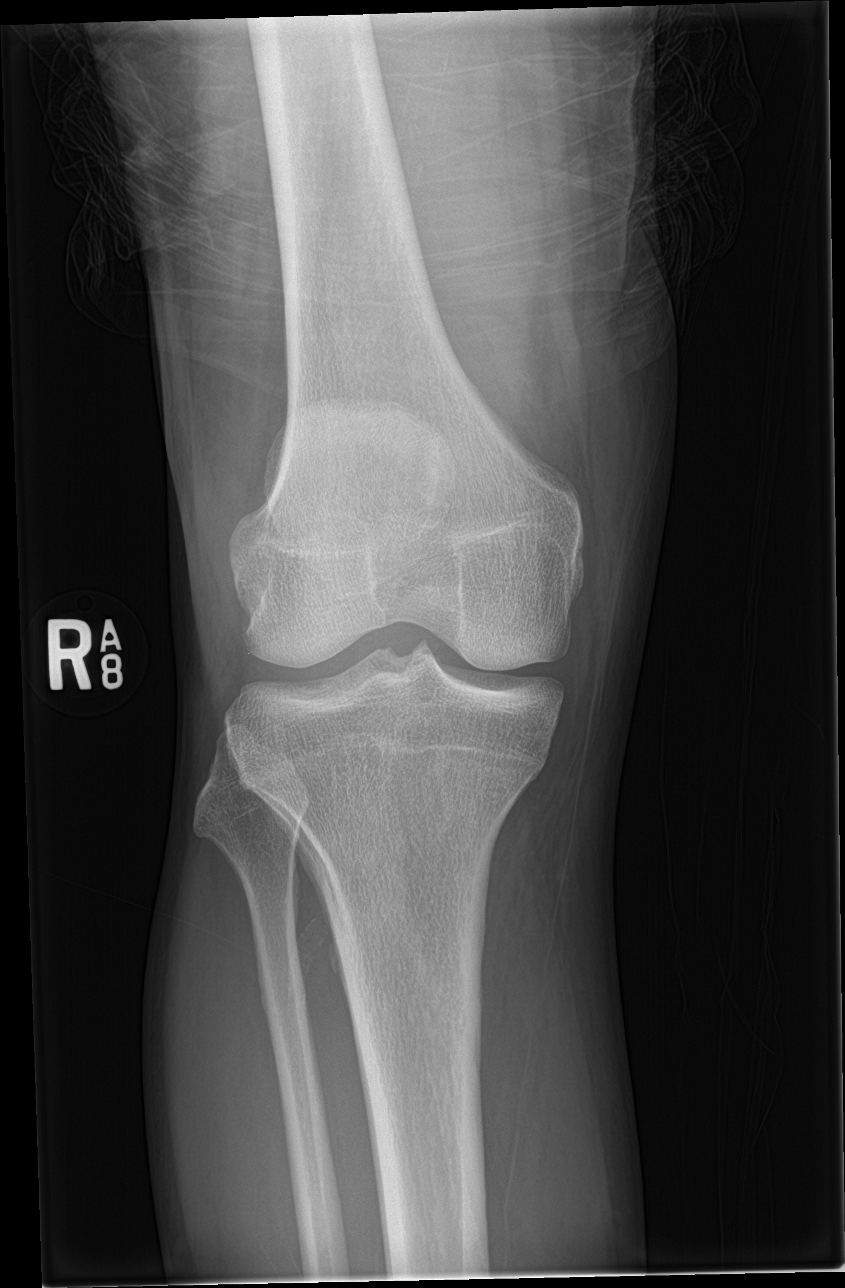

[knee lat]
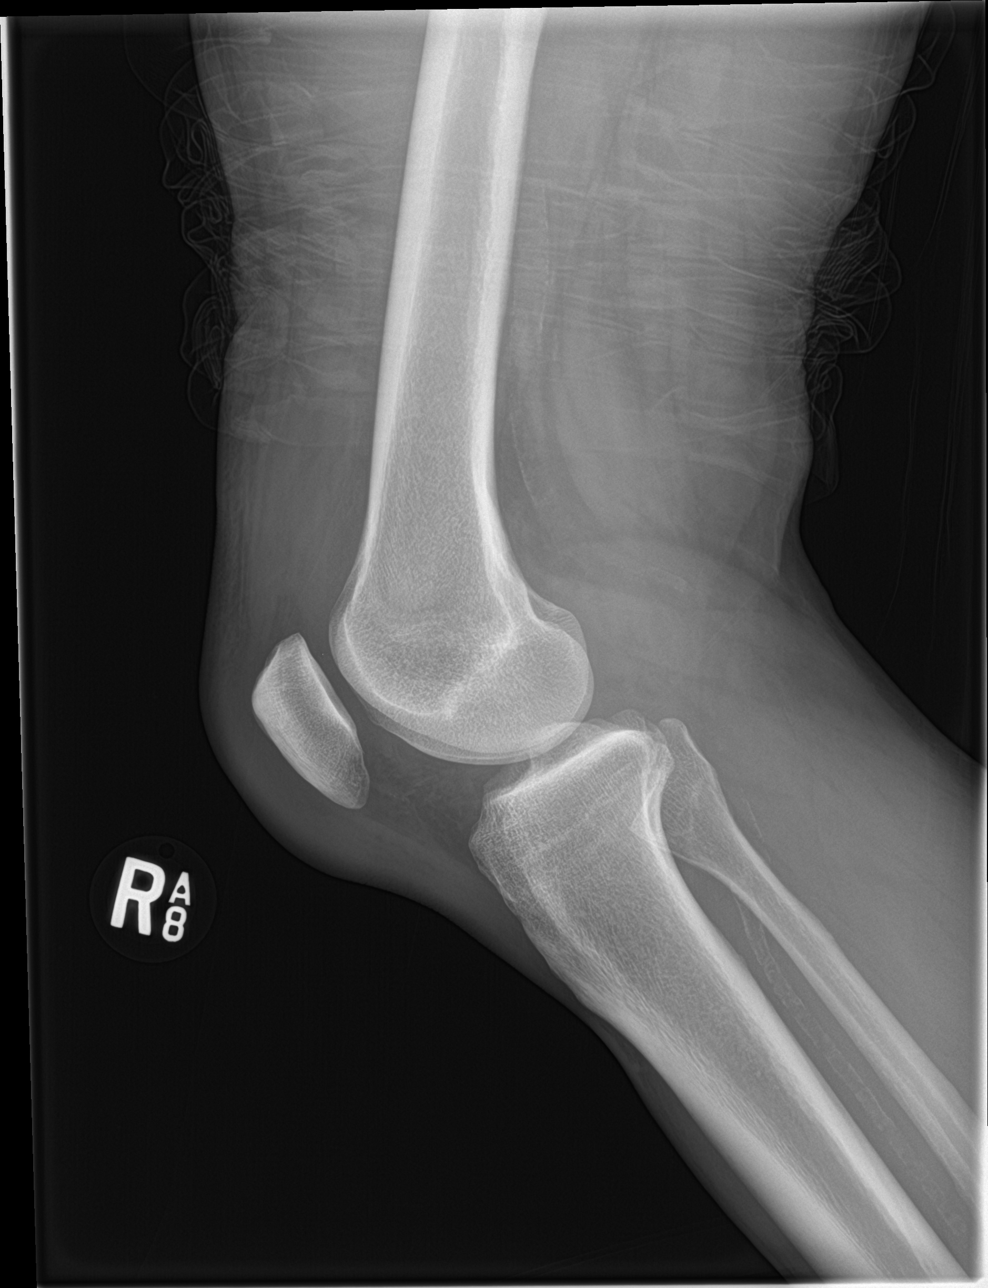

[knee obl (1 of 2)]
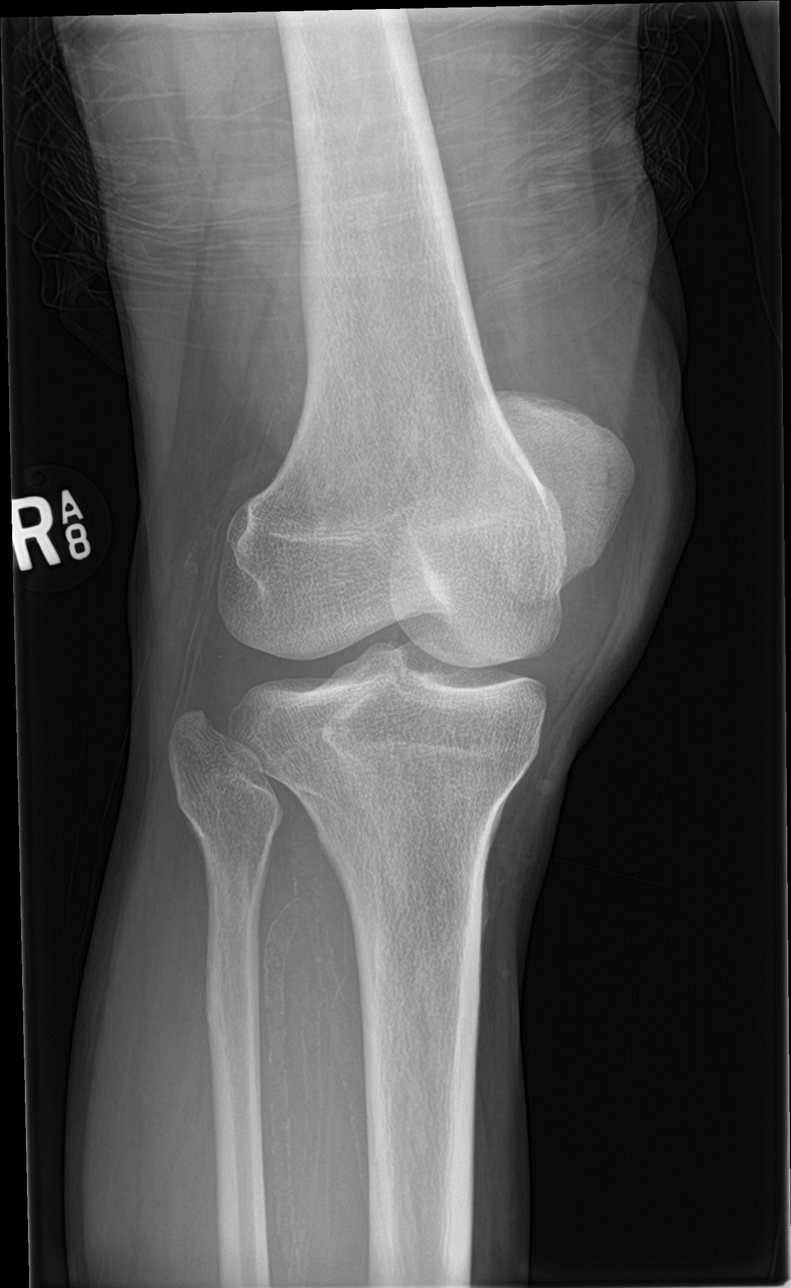

[knee obl (2 of 2)]
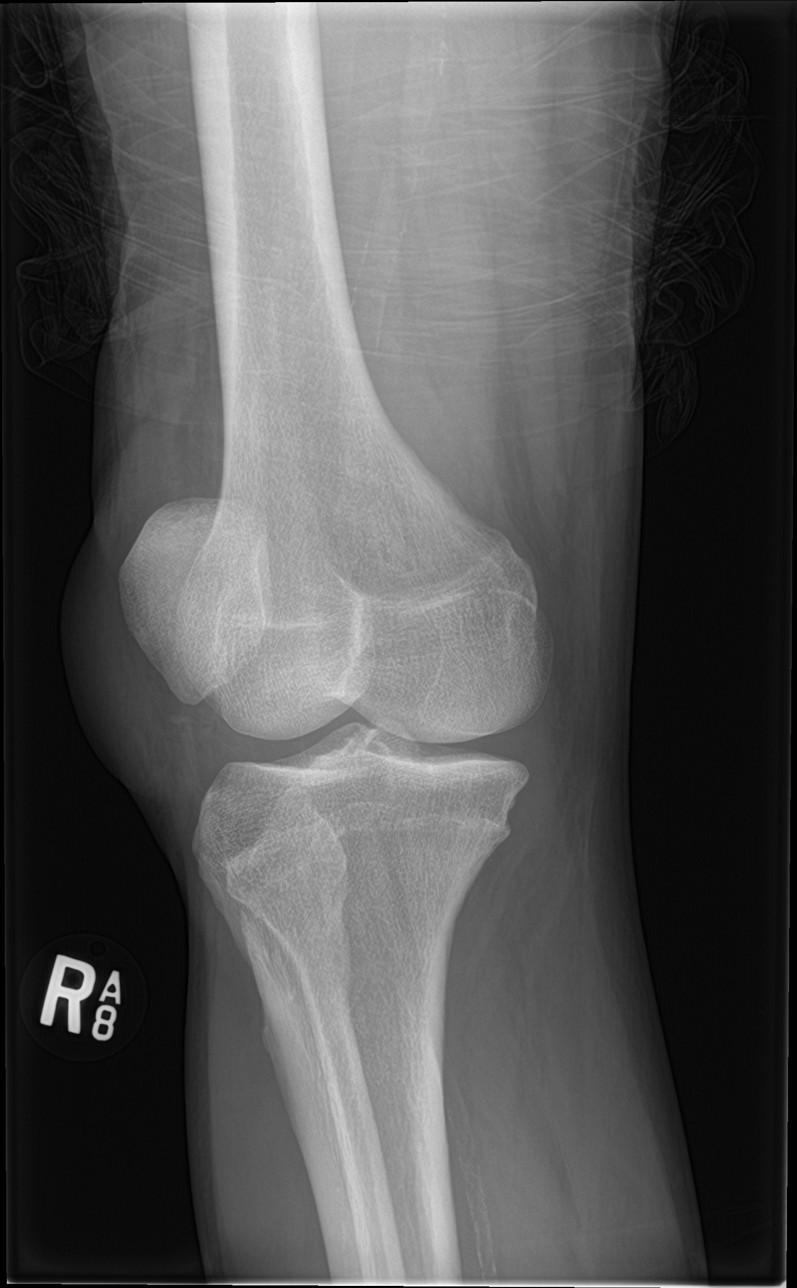

[4 of 4 positions shown; findings below may reference images not displayed]

FINDINGS: Prepatellar soft tissue swelling without gas or opaque foreign body.
Negative for fracture or malalignment. No convincing joint effusion.
Atherosclerotic calcification.
IMPRESSION: Prepatellar soft tissue swelling. No acute osseous finding or opaque
foreign body.

## 2020-02-14 ENCOUNTER — Other Ambulatory Visit: Payer: Self-pay | Admitting: Physician Assistant

## 2020-02-14 ENCOUNTER — Ambulatory Visit
Admission: RE | Admit: 2020-02-14 | Discharge: 2020-02-14 | Disposition: A | Discharge status: Home / Self Care | Payer: Medicare HMO | Source: Ambulatory Visit | Attending: Physician Assistant | Admitting: Physician Assistant

## 2020-02-14 DIAGNOSIS — R1031 Right lower quadrant pain: Secondary | ICD-10-CM

## 2020-02-14 DIAGNOSIS — R109 Unspecified abdominal pain: Secondary | ICD-10-CM

## 2020-10-21 DIAGNOSIS — F332 Major depressive disorder, recurrent severe without psychotic features: Secondary | ICD-10-CM | POA: Diagnosis not present

## 2020-10-24 DIAGNOSIS — R0981 Nasal congestion: Secondary | ICD-10-CM | POA: Diagnosis not present

## 2020-11-18 DIAGNOSIS — R972 Elevated prostate specific antigen [PSA]: Secondary | ICD-10-CM | POA: Diagnosis not present

## 2020-12-10 DIAGNOSIS — F332 Major depressive disorder, recurrent severe without psychotic features: Secondary | ICD-10-CM | POA: Diagnosis not present

## 2020-12-15 DIAGNOSIS — C61 Malignant neoplasm of prostate: Secondary | ICD-10-CM | POA: Diagnosis not present

## 2020-12-15 DIAGNOSIS — R972 Elevated prostate specific antigen [PSA]: Secondary | ICD-10-CM | POA: Diagnosis not present

## 2020-12-15 DIAGNOSIS — D075 Carcinoma in situ of prostate: Secondary | ICD-10-CM | POA: Diagnosis not present

## 2020-12-25 DIAGNOSIS — C61 Malignant neoplasm of prostate: Secondary | ICD-10-CM | POA: Diagnosis not present

## 2021-03-02 DIAGNOSIS — F332 Major depressive disorder, recurrent severe without psychotic features: Secondary | ICD-10-CM | POA: Diagnosis not present

## 2021-04-06 ENCOUNTER — Other Ambulatory Visit: Payer: Self-pay | Admitting: Urology

## 2021-04-06 DIAGNOSIS — C61 Malignant neoplasm of prostate: Secondary | ICD-10-CM

## 2021-05-13 ENCOUNTER — Other Ambulatory Visit: Payer: Medicare HMO

## 2021-05-25 DIAGNOSIS — F332 Major depressive disorder, recurrent severe without psychotic features: Secondary | ICD-10-CM | POA: Diagnosis not present

## 2021-06-15 DIAGNOSIS — Z79899 Other long term (current) drug therapy: Secondary | ICD-10-CM | POA: Diagnosis not present

## 2021-06-15 DIAGNOSIS — F3170 Bipolar disorder, currently in remission, most recent episode unspecified: Secondary | ICD-10-CM | POA: Diagnosis not present

## 2021-06-15 DIAGNOSIS — I700 Atherosclerosis of aorta: Secondary | ICD-10-CM | POA: Diagnosis not present

## 2021-06-15 DIAGNOSIS — Z0000 Encounter for general adult medical examination without abnormal findings: Secondary | ICD-10-CM | POA: Diagnosis not present

## 2021-06-15 DIAGNOSIS — C61 Malignant neoplasm of prostate: Secondary | ICD-10-CM | POA: Diagnosis not present

## 2021-06-15 DIAGNOSIS — R7303 Prediabetes: Secondary | ICD-10-CM | POA: Diagnosis not present

## 2021-06-15 DIAGNOSIS — Z87891 Personal history of nicotine dependence: Secondary | ICD-10-CM | POA: Diagnosis not present

## 2021-06-15 DIAGNOSIS — E7800 Pure hypercholesterolemia, unspecified: Secondary | ICD-10-CM | POA: Diagnosis not present

## 2021-06-23 DIAGNOSIS — H2513 Age-related nuclear cataract, bilateral: Secondary | ICD-10-CM | POA: Diagnosis not present

## 2021-06-23 DIAGNOSIS — H16223 Keratoconjunctivitis sicca, not specified as Sjogren's, bilateral: Secondary | ICD-10-CM | POA: Diagnosis not present

## 2021-06-30 DIAGNOSIS — C61 Malignant neoplasm of prostate: Secondary | ICD-10-CM | POA: Diagnosis not present

## 2021-08-21 DIAGNOSIS — F332 Major depressive disorder, recurrent severe without psychotic features: Secondary | ICD-10-CM | POA: Diagnosis not present

## 2021-10-13 DIAGNOSIS — J019 Acute sinusitis, unspecified: Secondary | ICD-10-CM | POA: Diagnosis not present

## 2021-10-21 DIAGNOSIS — E7800 Pure hypercholesterolemia, unspecified: Secondary | ICD-10-CM | POA: Diagnosis not present

## 2021-10-21 DIAGNOSIS — R7303 Prediabetes: Secondary | ICD-10-CM | POA: Diagnosis not present

## 2021-10-30 DIAGNOSIS — H6123 Impacted cerumen, bilateral: Secondary | ICD-10-CM | POA: Diagnosis not present

## 2021-10-30 DIAGNOSIS — R21 Rash and other nonspecific skin eruption: Secondary | ICD-10-CM | POA: Diagnosis not present

## 2021-10-30 DIAGNOSIS — J209 Acute bronchitis, unspecified: Secondary | ICD-10-CM | POA: Diagnosis not present

## 2021-11-10 DIAGNOSIS — F332 Major depressive disorder, recurrent severe without psychotic features: Secondary | ICD-10-CM | POA: Diagnosis not present

## 2021-12-28 DIAGNOSIS — C61 Malignant neoplasm of prostate: Secondary | ICD-10-CM | POA: Diagnosis not present

## 2022-01-04 DIAGNOSIS — N5201 Erectile dysfunction due to arterial insufficiency: Secondary | ICD-10-CM | POA: Diagnosis not present

## 2022-01-04 DIAGNOSIS — C61 Malignant neoplasm of prostate: Secondary | ICD-10-CM | POA: Diagnosis not present

## 2022-01-18 DIAGNOSIS — E7800 Pure hypercholesterolemia, unspecified: Secondary | ICD-10-CM | POA: Diagnosis not present

## 2022-01-18 DIAGNOSIS — R7303 Prediabetes: Secondary | ICD-10-CM | POA: Diagnosis not present

## 2022-01-28 DIAGNOSIS — F332 Major depressive disorder, recurrent severe without psychotic features: Secondary | ICD-10-CM | POA: Diagnosis not present

## 2022-04-26 DIAGNOSIS — F332 Major depressive disorder, recurrent severe without psychotic features: Secondary | ICD-10-CM | POA: Diagnosis not present

## 2022-04-29 DIAGNOSIS — H6122 Impacted cerumen, left ear: Secondary | ICD-10-CM | POA: Diagnosis not present

## 2022-04-29 DIAGNOSIS — H938X2 Other specified disorders of left ear: Secondary | ICD-10-CM | POA: Diagnosis not present

## 2022-06-28 DIAGNOSIS — C61 Malignant neoplasm of prostate: Secondary | ICD-10-CM | POA: Diagnosis not present

## 2022-07-05 DIAGNOSIS — N5201 Erectile dysfunction due to arterial insufficiency: Secondary | ICD-10-CM | POA: Diagnosis not present

## 2022-07-05 DIAGNOSIS — C61 Malignant neoplasm of prostate: Secondary | ICD-10-CM | POA: Diagnosis not present

## 2022-07-13 DIAGNOSIS — Z1211 Encounter for screening for malignant neoplasm of colon: Secondary | ICD-10-CM | POA: Diagnosis not present

## 2022-07-13 DIAGNOSIS — Z23 Encounter for immunization: Secondary | ICD-10-CM | POA: Diagnosis not present

## 2022-07-13 DIAGNOSIS — I700 Atherosclerosis of aorta: Secondary | ICD-10-CM | POA: Diagnosis not present

## 2022-07-13 DIAGNOSIS — F3170 Bipolar disorder, currently in remission, most recent episode unspecified: Secondary | ICD-10-CM | POA: Diagnosis not present

## 2022-07-13 DIAGNOSIS — E1169 Type 2 diabetes mellitus with other specified complication: Secondary | ICD-10-CM | POA: Diagnosis not present

## 2022-07-13 DIAGNOSIS — Z6828 Body mass index (BMI) 28.0-28.9, adult: Secondary | ICD-10-CM | POA: Diagnosis not present

## 2022-07-13 DIAGNOSIS — E7800 Pure hypercholesterolemia, unspecified: Secondary | ICD-10-CM | POA: Diagnosis not present

## 2022-07-13 DIAGNOSIS — E663 Overweight: Secondary | ICD-10-CM | POA: Diagnosis not present

## 2022-07-13 DIAGNOSIS — C61 Malignant neoplasm of prostate: Secondary | ICD-10-CM | POA: Diagnosis not present

## 2022-07-13 DIAGNOSIS — Z0000 Encounter for general adult medical examination without abnormal findings: Secondary | ICD-10-CM | POA: Diagnosis not present

## 2022-07-13 DIAGNOSIS — Z1389 Encounter for screening for other disorder: Secondary | ICD-10-CM | POA: Diagnosis not present

## 2022-07-15 DIAGNOSIS — H2513 Age-related nuclear cataract, bilateral: Secondary | ICD-10-CM | POA: Diagnosis not present

## 2022-07-15 DIAGNOSIS — H16223 Keratoconjunctivitis sicca, not specified as Sjogren's, bilateral: Secondary | ICD-10-CM | POA: Diagnosis not present

## 2022-07-16 DIAGNOSIS — F332 Major depressive disorder, recurrent severe without psychotic features: Secondary | ICD-10-CM | POA: Diagnosis not present

## 2022-09-01 DIAGNOSIS — J209 Acute bronchitis, unspecified: Secondary | ICD-10-CM | POA: Diagnosis not present

## 2022-09-01 DIAGNOSIS — Z6829 Body mass index (BMI) 29.0-29.9, adult: Secondary | ICD-10-CM | POA: Diagnosis not present

## 2022-10-27 DIAGNOSIS — F3181 Bipolar II disorder: Secondary | ICD-10-CM | POA: Diagnosis not present

## 2022-10-27 DIAGNOSIS — F332 Major depressive disorder, recurrent severe without psychotic features: Secondary | ICD-10-CM | POA: Diagnosis not present

## 2022-10-27 DIAGNOSIS — F250 Schizoaffective disorder, bipolar type: Secondary | ICD-10-CM | POA: Diagnosis not present

## 2022-10-27 DIAGNOSIS — F312 Bipolar disorder, current episode manic severe with psychotic features: Secondary | ICD-10-CM | POA: Diagnosis not present

## 2022-10-27 DIAGNOSIS — R69 Illness, unspecified: Secondary | ICD-10-CM | POA: Diagnosis not present

## 2022-11-24 ENCOUNTER — Other Ambulatory Visit: Payer: Self-pay | Admitting: Urology

## 2022-11-24 DIAGNOSIS — C61 Malignant neoplasm of prostate: Secondary | ICD-10-CM

## 2022-12-20 ENCOUNTER — Encounter: Payer: Self-pay | Admitting: Urology

## 2022-12-28 ENCOUNTER — Other Ambulatory Visit: Payer: Medicare HMO

## 2023-01-12 DIAGNOSIS — R69 Illness, unspecified: Secondary | ICD-10-CM | POA: Diagnosis not present

## 2023-02-28 DIAGNOSIS — Z6828 Body mass index (BMI) 28.0-28.9, adult: Secondary | ICD-10-CM | POA: Diagnosis not present

## 2023-02-28 DIAGNOSIS — E1169 Type 2 diabetes mellitus with other specified complication: Secondary | ICD-10-CM | POA: Diagnosis not present

## 2023-02-28 DIAGNOSIS — I700 Atherosclerosis of aorta: Secondary | ICD-10-CM | POA: Diagnosis not present

## 2023-02-28 DIAGNOSIS — L308 Other specified dermatitis: Secondary | ICD-10-CM | POA: Diagnosis not present

## 2023-02-28 DIAGNOSIS — F3170 Bipolar disorder, currently in remission, most recent episode unspecified: Secondary | ICD-10-CM | POA: Diagnosis not present

## 2023-02-28 DIAGNOSIS — E119 Type 2 diabetes mellitus without complications: Secondary | ICD-10-CM | POA: Diagnosis not present

## 2023-02-28 DIAGNOSIS — C61 Malignant neoplasm of prostate: Secondary | ICD-10-CM | POA: Diagnosis not present

## 2023-03-22 DIAGNOSIS — E663 Overweight: Secondary | ICD-10-CM | POA: Diagnosis not present

## 2023-03-22 DIAGNOSIS — E1169 Type 2 diabetes mellitus with other specified complication: Secondary | ICD-10-CM | POA: Diagnosis not present

## 2023-03-22 DIAGNOSIS — Z6827 Body mass index (BMI) 27.0-27.9, adult: Secondary | ICD-10-CM | POA: Diagnosis not present

## 2023-05-10 DIAGNOSIS — K2970 Gastritis, unspecified, without bleeding: Secondary | ICD-10-CM | POA: Diagnosis not present

## 2023-07-27 DIAGNOSIS — Z0000 Encounter for general adult medical examination without abnormal findings: Secondary | ICD-10-CM | POA: Diagnosis not present

## 2023-07-27 DIAGNOSIS — Z1331 Encounter for screening for depression: Secondary | ICD-10-CM | POA: Diagnosis not present

## 2023-07-27 DIAGNOSIS — Z6825 Body mass index (BMI) 25.0-25.9, adult: Secondary | ICD-10-CM | POA: Diagnosis not present

## 2023-07-27 DIAGNOSIS — Z1159 Encounter for screening for other viral diseases: Secondary | ICD-10-CM | POA: Diagnosis not present

## 2023-08-24 DIAGNOSIS — E7800 Pure hypercholesterolemia, unspecified: Secondary | ICD-10-CM | POA: Diagnosis not present

## 2023-08-24 DIAGNOSIS — C61 Malignant neoplasm of prostate: Secondary | ICD-10-CM | POA: Diagnosis not present

## 2023-08-24 DIAGNOSIS — F319 Bipolar disorder, unspecified: Secondary | ICD-10-CM | POA: Diagnosis not present

## 2023-08-24 DIAGNOSIS — R413 Other amnesia: Secondary | ICD-10-CM | POA: Diagnosis not present

## 2023-08-24 DIAGNOSIS — E119 Type 2 diabetes mellitus without complications: Secondary | ICD-10-CM | POA: Diagnosis not present

## 2024-01-13 DIAGNOSIS — M545 Low back pain, unspecified: Secondary | ICD-10-CM | POA: Diagnosis not present

## 2024-01-13 DIAGNOSIS — T148XXA Other injury of unspecified body region, initial encounter: Secondary | ICD-10-CM | POA: Diagnosis not present

## 2024-02-13 DIAGNOSIS — M25561 Pain in right knee: Secondary | ICD-10-CM | POA: Diagnosis not present

## 2024-02-13 DIAGNOSIS — Z6824 Body mass index (BMI) 24.0-24.9, adult: Secondary | ICD-10-CM | POA: Diagnosis not present

## 2024-03-19 DIAGNOSIS — Z6824 Body mass index (BMI) 24.0-24.9, adult: Secondary | ICD-10-CM | POA: Diagnosis not present

## 2024-03-19 DIAGNOSIS — R0981 Nasal congestion: Secondary | ICD-10-CM | POA: Diagnosis not present
